# Patient Record
Sex: Female | Born: 1970 | Race: White | Hispanic: No | Marital: Married | Smoking: Never smoker
Health system: Southern US, Community
[De-identification: ages and names within clinical notes are randomized; demographics above are authoritative.]

## PROBLEM LIST (undated history)

## (undated) DIAGNOSIS — F419 Anxiety disorder, unspecified: Secondary | ICD-10-CM

## (undated) DIAGNOSIS — J302 Other seasonal allergic rhinitis: Secondary | ICD-10-CM

## (undated) DIAGNOSIS — C439 Malignant melanoma of skin, unspecified: Secondary | ICD-10-CM

## (undated) DIAGNOSIS — F1910 Other psychoactive substance abuse, uncomplicated: Secondary | ICD-10-CM

## (undated) DIAGNOSIS — E079 Disorder of thyroid, unspecified: Secondary | ICD-10-CM

## (undated) DIAGNOSIS — M1990 Unspecified osteoarthritis, unspecified site: Secondary | ICD-10-CM

## (undated) DIAGNOSIS — F32A Depression, unspecified: Secondary | ICD-10-CM

## (undated) DIAGNOSIS — K219 Gastro-esophageal reflux disease without esophagitis: Secondary | ICD-10-CM

## (undated) HISTORY — DX: Disorder of thyroid, unspecified: E07.9

## (undated) HISTORY — DX: Gastro-esophageal reflux disease without esophagitis: K21.9

## (undated) HISTORY — DX: Other seasonal allergic rhinitis: J30.2

## (undated) HISTORY — DX: Malignant melanoma of skin, unspecified: C43.9

## (undated) HISTORY — DX: Unspecified osteoarthritis, unspecified site: M19.90

## (undated) HISTORY — DX: Other psychoactive substance abuse, uncomplicated: F19.10

## (undated) HISTORY — DX: Anxiety disorder, unspecified: F41.9

## (undated) HISTORY — DX: Depression, unspecified: F32.A

---
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
## 2018-11-07 DIAGNOSIS — S92009A Unspecified fracture of unspecified calcaneus, initial encounter for closed fracture: Secondary | ICD-10-CM | POA: Insufficient documentation

## 2018-11-13 ENCOUNTER — Other Ambulatory Visit: Payer: Self-pay | Admitting: Orthopedic Surgery

## 2018-11-13 DIAGNOSIS — S92025G Nondisplaced fracture of anterior process of left calcaneus, subsequent encounter for fracture with delayed healing: Secondary | ICD-10-CM

## 2018-11-19 ENCOUNTER — Ambulatory Visit
Admission: RE | Admit: 2018-11-19 | Discharge: 2018-11-19 | Disposition: A | Discharge status: Home / Self Care | Payer: Non-veteran care | Source: Ambulatory Visit | Attending: Orthopedic Surgery | Admitting: Orthopedic Surgery

## 2018-11-19 DIAGNOSIS — S92025G Nondisplaced fracture of anterior process of left calcaneus, subsequent encounter for fracture with delayed healing: Secondary | ICD-10-CM

## 2019-01-25 DIAGNOSIS — M25562 Pain in left knee: Secondary | ICD-10-CM | POA: Insufficient documentation

## 2020-07-01 ENCOUNTER — Other Ambulatory Visit (HOSPITAL_COMMUNITY): Payer: Self-pay

## 2020-07-06 ENCOUNTER — Ambulatory Visit: Payer: No Typology Code available for payment source | Admitting: Podiatry

## 2020-07-08 ENCOUNTER — Ambulatory Visit (INDEPENDENT_AMBULATORY_CARE_PROVIDER_SITE_OTHER): Payer: No Typology Code available for payment source | Admitting: Podiatry

## 2020-07-08 ENCOUNTER — Ambulatory Visit (INDEPENDENT_AMBULATORY_CARE_PROVIDER_SITE_OTHER): Payer: No Typology Code available for payment source

## 2020-07-08 ENCOUNTER — Other Ambulatory Visit: Payer: Self-pay

## 2020-07-08 DIAGNOSIS — M722 Plantar fascial fibromatosis: Secondary | ICD-10-CM

## 2020-07-08 DIAGNOSIS — M7751 Other enthesopathy of right foot: Secondary | ICD-10-CM

## 2020-07-08 DIAGNOSIS — M21619 Bunion of unspecified foot: Secondary | ICD-10-CM

## 2020-07-08 DIAGNOSIS — M21611 Bunion of right foot: Secondary | ICD-10-CM

## 2020-07-08 MED ORDER — METHYLPREDNISOLONE 4 MG PO TBPK: ORAL_TABLET | ORAL | 0 refills | Status: DC

## 2020-07-27 NOTE — Progress Notes (Signed)
   Subjective:  50 y.o. female presenting today as a new patient for evaluation of right foot and ankle pain is been going on approximately 3 months now.  Patient states that she experiences sharp pain all over the foot and the ankle.  It radiates all over.  She denies a history of injury.  She has not done anything for treatment.   No past medical history on file.   Objective / Physical Exam:  General:  The patient is alert and oriented x3 in no acute distress. Dermatology:  Skin is warm, dry and supple bilateral lower extremities. Negative for open lesions or macerations. Vascular:  Palpable pedal pulses bilaterally. No edema or erythema noted. Capillary refill within normal limits. Neurological:  Epicritic and protective threshold grossly intact bilaterally.  Musculoskeletal Exam:  Pain on palpation to the anterior lateral medial aspects of the patient's right ankle as well as pain on palpation along the plantar fascia of the right foot. Mild edema noted. Range of motion within normal limits to all pedal and ankle joints bilateral. Muscle strength 5/5 in all groups bilateral.   Radiographic Exam:  Normal osseous mineralization. Joint spaces preserved. No fracture/dislocation/boney destruction.    Assessment: 1.  Plantar fasciitis right 2.  Right dorsal foot pain 3.  Ankle pain/capsulitis right  Plan of Care:  1. Patient was evaluated. X-Rays reviewed.  2.  Patient declined injections today 3.  Appointment with Pedorthist for custom molded orthotics 4.  Prescription for Medrol Dosepak 5.  Prescription for Motrin 800 mg 3 times daily to begin taking after completion of the Dosepak as needed 6.  Return to clinic as needed   Felecia Shelling, DPM [facility]  Dr. Felecia Shelling, DPM    [address] [place], [place] [address]                Office [phone]  Fax [phone]

## 2020-07-29 ENCOUNTER — Telehealth: Payer: Self-pay | Admitting: Podiatry

## 2020-07-29 NOTE — Telephone Encounter (Signed)
Received voicemail from [employer] @ the [facility] stating pt is needing to get inserts and that we cannot dispense thru our office and she wanted a call back to get a documents that we have to fill out for pt. Her ext is [phone]...  I called back and left message for Thereasa Solo to call me back but that I was out of the office until next Tuesday.5.31.2022... but we have gotten orthotics thru the [facility] with Clifton Custard Bage.

## 2020-08-11 NOTE — Telephone Encounter (Signed)
Faxed forms and office visit note to [employer] @ [facility].

## 2020-08-26 ENCOUNTER — Telehealth: Payer: Self-pay | Admitting: Podiatry

## 2020-08-26 NOTE — Telephone Encounter (Signed)
Per Merleen Nicely  bcbs mcr the brace codes L1940/L2820/L2330 are valid and billable no Josem Kaufmann is needed unless over 1200.00 . Covered @ 80% of allowable. Out of pocket is 5800.00 pt has met 263.21.. reference number [phone].Marland KitchenMarland Kitchen

## 2020-08-26 NOTE — Telephone Encounter (Signed)
Error

## 2020-08-28 ENCOUNTER — Telehealth: Payer: Self-pay | Admitting: Podiatry

## 2020-08-28 NOTE — Telephone Encounter (Signed)
Called pt because I have had no luck finding the foam impression box that she said she was molded for when she was here on 5.4.2022. It was on hold waiting on the [place] authorization. She is upset about it. She is going on vacation starting tomorrow and starts a new job next Friday. She lives in [place]. She asked me to look again and I told her I would look next week and ask again. She is having a lot of foot pain and has not time to come back to [place] to be remolded. She may end up switching locations to get the orthotics. I apologized and told her I would call her next week once I look again.

## 2020-09-18 ENCOUNTER — Other Ambulatory Visit: Payer: Self-pay

## 2020-09-18 ENCOUNTER — Other Ambulatory Visit: Payer: No Typology Code available for payment source

## 2020-09-18 DIAGNOSIS — M7751 Other enthesopathy of right foot: Secondary | ICD-10-CM | POA: Diagnosis not present

## 2020-09-18 DIAGNOSIS — M722 Plantar fascial fibromatosis: Secondary | ICD-10-CM | POA: Diagnosis not present

## 2020-10-09 ENCOUNTER — Other Ambulatory Visit: Payer: No Typology Code available for payment source

## 2020-10-09 ENCOUNTER — Other Ambulatory Visit: Payer: Self-pay

## 2020-11-02 ENCOUNTER — Telehealth: Payer: Self-pay | Admitting: Podiatry

## 2020-11-02 NOTE — Telephone Encounter (Signed)
Patient put in an online request to be seen. She is scheduled to see you on 9/14 but wanted to request an MRI before she came in to rule out capsulitis or another underlying issue.

## 2020-11-03 ENCOUNTER — Other Ambulatory Visit: Payer: Self-pay | Admitting: Podiatry

## 2020-11-03 DIAGNOSIS — M79671 Pain in right foot: Secondary | ICD-10-CM

## 2020-11-03 NOTE — Telephone Encounter (Signed)
MRI ordered.  Please contact patient and cancel appointment for 11/18/2020.  Ask the patient to make a new appointment once she knows the date for the MRI.  Ideally I would like to see her about 2 days after the MRI is completed.  Thanks, Dr. Logan Bores

## 2020-11-04 NOTE — Telephone Encounter (Signed)
Called patient and informed her that the MRI was ordered and to call us back once she has it scheduled. I canceled her appt on 9/14.

## 2020-11-18 ENCOUNTER — Ambulatory Visit: Payer: No Typology Code available for payment source | Admitting: Podiatry

## 2020-11-21 ENCOUNTER — Other Ambulatory Visit: Payer: Self-pay

## 2020-11-21 ENCOUNTER — Ambulatory Visit
Admission: RE | Admit: 2020-11-21 | Discharge: 2020-11-21 | Disposition: A | Discharge status: Home / Self Care | Payer: Self-pay | Source: Ambulatory Visit | Attending: Podiatry | Admitting: Podiatry

## 2020-11-21 DIAGNOSIS — M79671 Pain in right foot: Secondary | ICD-10-CM

## 2020-12-14 ENCOUNTER — Other Ambulatory Visit: Payer: Self-pay

## 2020-12-14 ENCOUNTER — Ambulatory Visit (INDEPENDENT_AMBULATORY_CARE_PROVIDER_SITE_OTHER): Payer: No Typology Code available for payment source | Admitting: Podiatry

## 2020-12-14 DIAGNOSIS — M2011 Hallux valgus (acquired), right foot: Secondary | ICD-10-CM

## 2020-12-14 DIAGNOSIS — M2041 Other hammer toe(s) (acquired), right foot: Secondary | ICD-10-CM | POA: Diagnosis not present

## 2020-12-14 DIAGNOSIS — M722 Plantar fascial fibromatosis: Secondary | ICD-10-CM | POA: Diagnosis not present

## 2020-12-14 DIAGNOSIS — M7751 Other enthesopathy of right foot: Secondary | ICD-10-CM

## 2020-12-14 MED ORDER — METHYLPREDNISOLONE 4 MG PO TBPK: ORAL_TABLET | ORAL | 0 refills | Status: DC

## 2020-12-27 DIAGNOSIS — M7751 Other enthesopathy of right foot: Secondary | ICD-10-CM | POA: Diagnosis not present

## 2020-12-27 MED ORDER — BETAMETHASONE SOD PHOS & ACET 6 (3-3) MG/ML IJ SUSP
3.0000 mg | Freq: Once | INTRAMUSCULAR | Status: AC
  Administered 2020-12-27: 3 mg via INTRA_ARTICULAR

## 2020-12-27 NOTE — Progress Notes (Signed)
   Subjective: 50 y.o. female presenting today for evaluation of pain and tenderness to the right foot.  Patient states there has been no significant improvement since she was last seen in the office several months prior.  She continues to have pain with walking.  She presents for further treatment and evaluation  No past medical history on file.   Objective: Physical Exam General: The patient is alert and oriented x3 in no acute distress.  Dermatology: Skin is cool, dry and supple bilateral lower extremities. Negative for open lesions or macerations.  Vascular: Palpable pedal pulses bilaterally. No edema or erythema noted. Capillary refill within normal limits.  Neurological: Epicritic and protective threshold grossly intact bilaterally.   Musculoskeletal Exam: Clinical evidence of bunion deformity noted to the respective foot. There is moderate pain on palpation range of motion of the first MPJ. Lateral deviation of the hallux noted consistent with hallux abductovalgus. Hammertoe contracture also noted on clinical exam to digits 2 of the right foot. Symptomatic pain on palpation and range of motion also noted to the metatarsal phalangeal joints of the respective hammertoe digits.    MR foot RT wo contrast 11/21/2020: 1. Possible inflammatory changes plantar to the 2nd metatarsophalangeal joint with a small joint effusion and mild intermetatarsal bursitis in the 1st web space. 2. The forefoot muscles and tendons appear unremarkable. The visualized distal aspect of the plantar fascia appears normal. 3. Mild midfoot and 1st MTP degenerative changes. No acute osseous findings.   Assessment: 1. HAV w/ bunion deformity right 2. Hammertoe deformity second right 3.  Second MTP joint capsulitis right   Plan of Care:  1. Patient was evaluated. 2.  Injection of 0.5 cc Celestone Soluspan injection of the second MTP joint right foot 3.  Prescription for Medrol Dosepak.  Continue the Motrin  800 mg after completion of the Dosepak 4.  Continue wearing custom molded orthotics.  Appointment made today with Pedorthist to modify the metatarsal pads built within the orthotics 5.  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 Felecia Shelling, DPM    [address] [place], [place] [address]                Office [phone]  Fax [phone]

## 2021-02-05 ENCOUNTER — Other Ambulatory Visit: Payer: No Typology Code available for payment source

## 2021-04-09 DIAGNOSIS — K219 Gastro-esophageal reflux disease without esophagitis: Secondary | ICD-10-CM | POA: Insufficient documentation

## 2021-04-09 DIAGNOSIS — H6692 Otitis media, unspecified, left ear: Secondary | ICD-10-CM | POA: Insufficient documentation

## 2021-04-09 DIAGNOSIS — E0590 Thyrotoxicosis, unspecified without thyrotoxic crisis or storm: Secondary | ICD-10-CM | POA: Insufficient documentation

## 2021-04-09 DIAGNOSIS — F419 Anxiety disorder, unspecified: Secondary | ICD-10-CM | POA: Insufficient documentation

## 2021-04-12 ENCOUNTER — Other Ambulatory Visit: Payer: Self-pay

## 2021-04-12 ENCOUNTER — Ambulatory Visit (INDEPENDENT_AMBULATORY_CARE_PROVIDER_SITE_OTHER): Payer: Non-veteran care | Admitting: Podiatry

## 2021-04-12 DIAGNOSIS — M2011 Hallux valgus (acquired), right foot: Secondary | ICD-10-CM

## 2021-04-12 DIAGNOSIS — M7751 Other enthesopathy of right foot: Secondary | ICD-10-CM

## 2021-04-12 MED ORDER — BETAMETHASONE SOD PHOS & ACET 6 (3-3) MG/ML IJ SUSP
3.0000 mg | Freq: Once | INTRAMUSCULAR | Status: AC
  Administered 2021-04-12: 3 mg via INTRA_ARTICULAR

## 2021-04-12 NOTE — Progress Notes (Signed)
° °  Subjective: 51 y.o. female presenting today for follow-up evaluation of pain and tenderness to the right foot.  Patient states the injections helped significantly for a few months.  She has also been wearing the orthotics.  She does say that over the past month she has had an increase in pain and tenderness associated the right forefoot.  She would like to discuss surgery but she is going on a trip to [place] this summer and would like to plan for surgery after returning from her trip.  No past medical history on file.   Objective: Physical Exam General: The patient is alert and oriented x3 in no acute distress.  Dermatology: Skin is cool, dry and supple bilateral lower extremities. Negative for open lesions or macerations.  Vascular: Palpable pedal pulses bilaterally. No edema or erythema noted. Capillary refill within normal limits.  Neurological: Epicritic and protective threshold grossly intact bilaterally.   Musculoskeletal Exam: Clinical evidence of bunion deformity noted to the respective foot. There is moderate pain on palpation range of motion of the first MPJ. Lateral deviation of the hallux noted consistent with hallux abductovalgus. Hammertoe contracture also noted on clinical exam to digits 2 of the right foot. Symptomatic pain on palpation and range of motion also noted to the metatarsal phalangeal joints of the respective hammertoe digits as well as the first MTP.    MR foot RT wo contrast 11/21/2020: 1. Possible inflammatory changes plantar to the 2nd metatarsophalangeal joint with a small joint effusion and mild intermetatarsal bursitis in the 1st web space. 2. The forefoot muscles and tendons appear unremarkable. The visualized distal aspect of the plantar fascia appears normal. 3. Mild midfoot and 1st MTP degenerative changes. No acute osseous findings.   Assessment: 1. HAV w/ bunion deformity right 2. Hammertoe deformity second right 3.  First and second MTP joint  capsulitis right   Plan of Care:  1. Patient was evaluated. 2.  Injection of 0.5 cc Celestone Soluspan injection of the first and second MTP joint right foot 3.  Continue prescription strength Motrin 800 as per prescribing physician  4.  Continue wearing custom molded orthotics.  Patient states that once they were broken and they feel much better.  She never had the modified 5.  Patient is going on a trip to [place] May 31 - mid June.  Return to clinic just prior to trip for possible injection but also to schedule surgery correction of the bunion and hammertoe deformity.  Today we again lightly discussed surgery associated to the patient's symptoms and pathology.  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 Felecia Shelling, DPM    [address] [place], [place] [address]                Office [phone]  Fax [phone]

## 2021-07-28 ENCOUNTER — Ambulatory Visit: Payer: Non-veteran care | Admitting: Podiatry

## 2021-07-28 ENCOUNTER — Ambulatory Visit (INDEPENDENT_AMBULATORY_CARE_PROVIDER_SITE_OTHER): Payer: Non-veteran care | Admitting: Podiatry

## 2021-07-28 DIAGNOSIS — M2011 Hallux valgus (acquired), right foot: Secondary | ICD-10-CM | POA: Diagnosis not present

## 2021-07-28 DIAGNOSIS — M7751 Other enthesopathy of right foot: Secondary | ICD-10-CM

## 2021-07-28 DIAGNOSIS — M2041 Other hammer toe(s) (acquired), right foot: Secondary | ICD-10-CM

## 2021-07-28 MED ORDER — BETAMETHASONE SOD PHOS & ACET 6 (3-3) MG/ML IJ SUSP
3.0000 mg | Freq: Once | INTRAMUSCULAR | Status: AC
  Administered 2021-07-28: 3 mg via INTRA_ARTICULAR

## 2021-07-28 NOTE — Progress Notes (Signed)
Subjective: 51 y.o. female presenting today for follow-up evaluation of pain and tenderness to the right foot.  Patient states the injections helped significantly for a few months.  She has also been wearing the orthotics.  She does say that over the past month she has had an increase in pain and tenderness associated the right forefoot.  She would like to discuss surgery but she is going on a trip to [place] this summer and would like to plan for surgery after returning from her trip.  No past medical history on file.   Objective: Physical Exam General: The patient is alert and oriented x3 in no acute distress.  Dermatology: Skin is cool, dry and supple bilateral lower extremities. Negative for open lesions or macerations.  Vascular: Palpable pedal pulses bilaterally. No edema or erythema noted. Capillary refill within normal limits.  Neurological: Epicritic and protective threshold grossly intact bilaterally.   Musculoskeletal Exam: Clinical evidence of bunion deformity noted to the respective foot. There is moderate pain on palpation range of motion of the first MPJ. Lateral deviation of the hallux noted consistent with hallux abductovalgus. Hammertoe contracture also noted on clinical exam to digits 2 of the right foot. Symptomatic pain on palpation and range of motion also noted to the metatarsal phalangeal joints of the respective hammertoe digits as well as the first MTP.    MR foot RT wo contrast 11/21/2020: 1. Possible inflammatory changes plantar to the 2nd metatarsophalangeal joint with a small joint effusion and mild intermetatarsal bursitis in the 1st web space. 2. The forefoot muscles and tendons appear unremarkable. The visualized distal aspect of the plantar fascia appears normal. 3. Mild midfoot and 1st MTP degenerative changes. No acute osseous findings.   Assessment: 1. HAV w/ bunion deformity right 2. Hammertoe deformity second right 3.  First and second MTP joint  capsulitis right   Plan of Care:  1. Patient was evaluated. 2.  Injection of 0.5 cc Celestone Soluspan injection of the first and second MTP joint right foot 3.  Continue prescription strength Motrin 800 as per prescribing physician.  Patient also received a Medrol Dosepak from her orthopedist, Dr. Cay Schillings, For left knee pain. 4.  Continue wearing custom molded orthotics.   5.  Patient is going on a trip to [place] May 31 - mid June.   6. Today we discussed in detail the conservative versus different surgical surgical management of the presenting pathology of the bunion and hammertoe deformity. The patient opts for surgical management.  Unfortunately conservative treatment has been unsuccessful in providing any sort of lasting alleviation of symptoms for the patient.  All possible complications and details of the procedure were explained. All patient questions were answered. No guarantees were expressed or implied. 7. Authorization for surgery was initiated today. Surgery will consist of bunionectomy with first metatarsal osteotomy right.  Hammertoe arthroplasty with capsulotomy second right.  Possible Weil shortening osteotomy second metatarsal right 8.  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    Edrick Kins, DPM [facility]  Dr. Edrick Kins, DPM    [address] [place], [place] [address]  Office [phone]  Fax [phone]

## 2021-08-05 HISTORY — PX: FOOT SURGERY: SHX648

## 2021-08-19 ENCOUNTER — Other Ambulatory Visit: Payer: Self-pay | Admitting: Podiatry

## 2021-08-19 DIAGNOSIS — M2011 Hallux valgus (acquired), right foot: Secondary | ICD-10-CM

## 2021-08-19 DIAGNOSIS — M2041 Other hammer toe(s) (acquired), right foot: Secondary | ICD-10-CM

## 2021-08-19 MED ORDER — OXYCODONE-ACETAMINOPHEN 5-325 MG PO TABS: 1.0000 | ORAL_TABLET | ORAL | 0 refills | Status: DC | PRN

## 2021-08-19 MED ORDER — IBUPROFEN 800 MG PO TABS: 800.0000 mg | ORAL_TABLET | Freq: Three times a day (TID) | ORAL | 1 refills | Status: DC

## 2021-08-19 NOTE — Progress Notes (Signed)
PRN postop 

## 2021-08-25 ENCOUNTER — Encounter: Payer: Self-pay | Admitting: Podiatry

## 2021-08-25 ENCOUNTER — Ambulatory Visit (INDEPENDENT_AMBULATORY_CARE_PROVIDER_SITE_OTHER): Payer: Non-veteran care

## 2021-08-25 ENCOUNTER — Ambulatory Visit (INDEPENDENT_AMBULATORY_CARE_PROVIDER_SITE_OTHER): Payer: No Typology Code available for payment source | Admitting: Podiatry

## 2021-08-25 ENCOUNTER — Other Ambulatory Visit: Payer: Self-pay | Admitting: Podiatry

## 2021-08-25 DIAGNOSIS — Z9889 Other specified postprocedural states: Secondary | ICD-10-CM

## 2021-08-25 NOTE — Progress Notes (Signed)
PRN postop 

## 2021-08-25 NOTE — Progress Notes (Signed)
   Subjective:  Patient presents today status post bunionectomy with double osteotomy, hammertoe repair second RT. DOS: 08/19/2021.  Patient states that she is doing well.  Pain is very tolerable.  She is no longer taking any pain medication.  She has kept the dressings clean dry and intact and minimally weightbearing in the cam boot as instructed.  No new complaints at this time  No past medical history on file.  Allergies  Allergen Reactions   Black Walnut Pollen Allergy Skin Test     Other reaction(s): Edema   Epinephrine     Other reaction(s): Irregular Heart Rate   Fluticasone     Other reaction(s): Migraine   Promethazine     Other reaction(s): Palpitation     Objective/Physical Exam Neurovascular status intact.  Skin incisions appear to be well coapted with sutures intact. No sign of infectious process noted. No dehiscence. No active bleeding noted. Moderate edema with some ecchymosis noted to the surgical extremity.  Radiographic Exam:  Orthopedic hardware and osteotomies sites appear to be stable with routine healing.  Good realignment of the first ray.  Percutaneous pin to the second digit is intact.  Arthrodesis/arthroplasty of the PIPJ noted.  Good apposition of the arthrodesis site  Assessment: 1. s/p bunionectomy with double osteotomy and hammertoe repair second RT. DOS: 08/19/2021   Plan of Care:  1. Patient was evaluated. X-rays reviewed 2.  Dressings changed.  Clean dry intact x1 week 3.  Continue minimal weightbearing in the cam boot 4.  Return to clinic in 1 week with the nurse for dressing change.  I will see the patient in 2 weeks for suture removal   Felecia Shelling, DPM [facility]  Dr. Felecia Shelling, DPM    [address] [place], [place] [address]                Office [phone]  Fax [phone]

## 2021-09-01 ENCOUNTER — Ambulatory Visit (INDEPENDENT_AMBULATORY_CARE_PROVIDER_SITE_OTHER): Payer: No Typology Code available for payment source | Admitting: Podiatry

## 2021-09-01 DIAGNOSIS — Z9889 Other specified postprocedural states: Secondary | ICD-10-CM

## 2021-09-08 NOTE — Progress Notes (Signed)
   Subjective:  Patient presents today status post bunionectomy with double osteotomy, hammertoe repair second RT. DOS: 08/19/2021.  Patient states that she is doing well.  Pain is very tolerable.  She is here for dressing change today denies any other acute complaints.  Minimal pain.  No past medical history on file.  Allergies  Allergen Reactions   Black Walnut Pollen Allergy Skin Test     Other reaction(s): Edema   Epinephrine     Other reaction(s): Irregular Heart Rate   Fluticasone     Other reaction(s): Migraine   Promethazine     Other reaction(s): Palpitation     Objective/Physical Exam Neurovascular status intact.  Skin incisions appear to be well coapted with sutures intact. No sign of infectious process noted. No dehiscence. No active bleeding noted. Moderate edema with some ecchymosis noted to the surgical extremity.  Radiographic Exam:  Orthopedic hardware and osteotomies sites appear to be stable with routine healing.  Good realignment of the first ray.  Percutaneous pin to the second digit is intact.  Arthrodesis/arthroplasty of the PIPJ noted.  Good apposition of the arthrodesis site  Assessment: 1. s/p bunionectomy with double osteotomy and hammertoe repair second RT. DOS: 08/19/2021   Plan of Care:  1. Patient was evaluated. X-rays reviewed 2.  Dressings changed.  Clean dry intact x1 week 3.  Continue minimal weightbearing in the cam boot 4.  Dressing was changed no signs of infection noted.  No signs of Deis is noted.

## 2021-09-14 ENCOUNTER — Ambulatory Visit (INDEPENDENT_AMBULATORY_CARE_PROVIDER_SITE_OTHER): Payer: No Typology Code available for payment source | Admitting: Podiatry

## 2021-09-14 ENCOUNTER — Ambulatory Visit (INDEPENDENT_AMBULATORY_CARE_PROVIDER_SITE_OTHER): Payer: Non-veteran care

## 2021-09-14 DIAGNOSIS — Z9889 Other specified postprocedural states: Secondary | ICD-10-CM

## 2021-09-15 ENCOUNTER — Encounter: Payer: No Typology Code available for payment source | Admitting: Podiatry

## 2021-09-24 NOTE — Progress Notes (Signed)
   Chief Complaint  Patient presents with   Routine Post Op    POV #3 DOS 08/19/2021 BUNIONECTOMY W/OSTEOTOMY RT, HAMMERTOE ARTHROPLASTY W/CAPSULOTOMY 2ND RT, POSS WEIL SHORTENING OSTEOTOMY 2ND METATARSAL RT    Subjective:  Patient presents today status post bunionectomy with double osteotomy, hammertoe repair second RT. DOS: 08/19/2021.  Patient doing well.  She continues to be weightbearing in the cam boot.  No new complaints at this time  No past medical history on file.  Allergies  Allergen Reactions   Black Walnut Pollen Allergy Skin Test     Other reaction(s): Edema   Epinephrine     Other reaction(s): Irregular Heart Rate   Fluticasone     Other reaction(s): Migraine   Promethazine     Other reaction(s): Palpitation     Objective/Physical Exam Neurovascular status intact.  Skin incisions appear to be well coapted with sutures intact. No sign of infectious process noted. No dehiscence. No active bleeding noted. Moderate edema with some ecchymosis noted to the surgical extremity.  Radiographic Exam:  Orthopedic hardware and osteotomies sites appear to be stable with routine healing.  Good realignment of the first ray.  Percutaneous pin to the second digit is intact.  Arthrodesis/arthroplasty of the PIPJ noted.  Good apposition of the arthrodesis site  Assessment: 1. s/p bunionectomy with double osteotomy and hammertoe repair second RT. DOS: 08/19/2021   Plan of Care:  1. Patient was evaluated. X-rays reviewed 2.  Sutures and percutaneous fixation pin removed today 3.  Continue minimal weightbearing in the cam boot for 2 additional weeks.  At that time the patient may begin to transition out of the cam boot into good supportive sneakers and shoes 4.  Return to clinic in 4 weeks for follow-up x-ray  Felecia Shelling, DPM [facility]  Dr. Felecia Shelling, DPM    [address] [place], [place] [address]                Office  [phone]  Fax [phone]

## 2021-10-01 ENCOUNTER — Telehealth: Payer: Self-pay

## 2021-10-01 NOTE — Telephone Encounter (Signed)
Spoke with patient.  She has resumed the cam walker boot.  She will stay in the boot until I see her on the next appointment 10/11/2021.  We will do follow-up x-rays next visit 10/11/2021.  Thanks, Dr. Logan Bores

## 2021-10-11 ENCOUNTER — Ambulatory Visit (INDEPENDENT_AMBULATORY_CARE_PROVIDER_SITE_OTHER): Payer: Non-veteran care

## 2021-10-11 ENCOUNTER — Ambulatory Visit (INDEPENDENT_AMBULATORY_CARE_PROVIDER_SITE_OTHER): Payer: No Typology Code available for payment source | Admitting: Podiatry

## 2021-10-11 DIAGNOSIS — Z9889 Other specified postprocedural states: Secondary | ICD-10-CM

## 2021-10-11 NOTE — Progress Notes (Signed)
   Chief Complaint  Patient presents with   Post-op Follow-up    POV #4 DOS 08/19/2021 BUNIONECTOMY W/OSTEOTOMY RT, HAMMERTOE ARTHROPLASTY W/CAPSULOTOMY 2ND RT, POSS WEIL SHORTENING OSTEOTOMY 2ND METATARSAL RT    Subjective:  Patient presents today status post bunionectomy with double osteotomy, hammertoe repair second RT. DOS: 08/19/2021.  Patient is doing well however she does state by the end of the day she experiences a significant amount of swelling to the foot.  No new complaints at this time  No past medical history on file.  Allergies  Allergen Reactions   Black Walnut Pollen Allergy Skin Test     Other reaction(s): Edema   Epinephrine     Other reaction(s): Irregular Heart Rate   Fluticasone     Other reaction(s): Migraine   Promethazine     Other reaction(s): Palpitation     Objective/Physical Exam Neurovascular status intact.  Skin incisions healed.  No erythema.  There is a significant amount of edema around the forefoot surgical area.  Radiographic Exam RT foot 10/11/2021:  Orthopedic hardware and osteotomies sites appear to be stable with routine healing.  No significant change since prior x-rays.  There does appear to be some lateral deviation of the second digit at the level of the MTP joint right foot.  Assessment: 1. s/p bunionectomy with double osteotomy and hammertoe repair second RT. DOS: 08/19/2021   Plan of Care:  1. Patient was evaluated. X-rays reviewed which appear stable. 2.  Patient has a significant amount of localized swelling without erythema to the surgical foot.  She has been very active and on her feet and she notices the most swelling by the end of the day. 3.  Compression ankle sleeve dispensed.  Wear daily 4.  Postsurgical shoe dispensed.  Wear daily.  Recommend reducing activity and elevation with ice is much as possible  5.  Patient also uses the knee scooter occasionally.  Continue as needed  6.  Return to clinic in 4 weeks for follow-up  x-ray  *Vice principal at a school.  Felecia Shelling, DPM [facility]  Dr. Felecia Shelling, DPM    [address] [place], [place] [address]                Office [phone]  Fax [phone]

## 2021-11-15 ENCOUNTER — Ambulatory Visit (INDEPENDENT_AMBULATORY_CARE_PROVIDER_SITE_OTHER): Payer: No Typology Code available for payment source | Admitting: Podiatry

## 2021-11-15 ENCOUNTER — Ambulatory Visit (INDEPENDENT_AMBULATORY_CARE_PROVIDER_SITE_OTHER): Payer: Non-veteran care

## 2021-11-15 ENCOUNTER — Ambulatory Visit: Payer: Non-veteran care

## 2021-11-15 DIAGNOSIS — Z9889 Other specified postprocedural states: Secondary | ICD-10-CM | POA: Diagnosis not present

## 2021-11-15 NOTE — Progress Notes (Signed)
   Chief Complaint  Patient presents with   Post-op Follow-up    Post op right foot, the patient states that she is still having swelling and pain.    Subjective:  Patient presents today status post bunionectomy with double osteotomy, hammertoe repair second RT. DOS: 08/19/2021.  Patient continues to have pain with swelling to the right foot especially after working all day.  The pain is mostly into the second toe.  She has been wearing good supportive shoes and sneakers.  No past medical history on file.  Allergies  Allergen Reactions   Black Walnut Pollen Allergy Skin Test     Other reaction(s): Edema   Epinephrine     Other reaction(s): Irregular Heart Rate   Fluticasone     Other reaction(s): Migraine   Promethazine     Other reaction(s): Palpitation      Objective/Physical Exam Neurovascular status intact.  Skin incisions healed.  There continues to be hypertrophic scar formation along the bunion site.  No erythema.  There continues to be some moderate edema.  There is also some pain on palpation with limited range of motion of the second MTP joint of the right foot likely secondary to scar tissue  Radiographic Exam RT foot 11/15/2021:  Orthopedic hardware and osteotomies sites appear to be stable with routine healing.  There continues to be lateral deviation of the second digit at the level of the MTP joint right foot.  Assessment: 1. s/p bunionectomy with double osteotomy and hammertoe repair second RT. DOS: 08/19/2021   Plan of Care:  1. Patient was evaluated.  X-rays reviewed. 2.  Unfortunately the patient continues to have lateral deviation to the right second digit.  I did discuss the possibility of revisional lateral release of the second MTP joint versus continued conservative care.  The patient states that she does have a family history of excessive scar tissue formation.  She says that her brother has had multiple surgeries to the foot and ankle with hypertrophic  scars.  I do believe some of the lateral deviation and limited range of motion second MTP is secondary to scar tissue formation 3.  Continue Mederma topical scar gel to the incision sites to reduce the hypertrophic scar formation  4.  Inject 0.5 cc Celestone Soluspan injected into the second MTP lateral aspect right foot to help break up scar tissue and reduce inflammation. 5.  Recommend daily stretching and range of motion exercises to the second MTP 6.  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 It is a [employer]  Felecia Shelling, DPM [facility]  Dr. Felecia Shelling, DPM    [address] [place], [place] [address]                Office [phone]  Fax [phone]

## 2022-02-07 ENCOUNTER — Ambulatory Visit (INDEPENDENT_AMBULATORY_CARE_PROVIDER_SITE_OTHER): Payer: No Typology Code available for payment source | Admitting: Podiatry

## 2022-02-07 ENCOUNTER — Ambulatory Visit (INDEPENDENT_AMBULATORY_CARE_PROVIDER_SITE_OTHER): Payer: No Typology Code available for payment source

## 2022-02-07 DIAGNOSIS — M7741 Metatarsalgia, right foot: Secondary | ICD-10-CM | POA: Diagnosis not present

## 2022-02-07 NOTE — Progress Notes (Signed)
   Chief Complaint  Patient presents with   Bunions    Right foot bunion.. constant pain around bunion sit overall there is pain within the whole foot. Some swelling around bunion sit but no redness.    Subjective:  Patient presents today status post bunionectomy with double osteotomy, hammertoe repair second RT. DOS: 08/19/2021.  Patient states that she continues to have pain and tenderness associated to the right surgical foot especially after working all day.  She says that the orthotics do not help.  She continues to have pain and tenderness on a daily basis.  Presenting for further treatment and evaluation  No past medical history on file.  Allergies  Allergen Reactions   Black Walnut Pollen Allergy Skin Test     Other reaction(s): Edema   Epinephrine     Other reaction(s): Irregular Heart Rate   Fluticasone     Other reaction(s): Migraine   Promethazine     Other reaction(s): Palpitation    11/15/2021  Objective/Physical Exam There continues to be lateral deviation of the second toe at the level of the MTP.  Associated tenderness with palpation throughout the joint.  There is also some minimal tenderness with palpation throughout the first ray likely secondary to compensation and abnormal gait  Radiographic Exam RT foot 02/07/2022:  Orthopedic screws and osteotomies sites appear to be stable with complete healing of the osteotomy sites.  There continues to be lateral deviation of the second digit at the level of the MTP joint right foot.  The second metatarsal head does extend well beyond the metatarsal parabola likely contributing to the lateral deviation of the second toe  Assessment: 1. s/p bunionectomy with double osteotomy and hammertoe repair second RT. DOS: 08/19/2021   Plan of Care:  1. Patient was evaluated.  X-rays reviewed. 2.  Today again we discussed the patient's pathology.  I do believe that the patient's lateral deviation of the second toe is secondary to the  elongation of the second metatarsal in relationship to the metatarsal parabola.  We discussed continued conservative care versus surgical management which would include Weil shortening osteotomy of the second metatarsal.  After discussing the risk benefits advantages and disadvantages the patient would like to pursue surgery to correct for the elongated metatarsal and potentially create a more rectus alignment of the second toe.  All patient questions were answered.  No guarantees were expressed or implied. 3.  Authorization for surgery was initiated today.  Surgery will consist of Weil shortening osteotomy second metatarsal right 4.  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  Presented today with her husband Felecia Shelling, DPM [facility]  Dr. Felecia Shelling, DPM    [address] [place], [place] [address]                Office [phone]  Fax [phone]

## 2022-02-08 ENCOUNTER — Telehealth: Payer: Self-pay | Admitting: Podiatry

## 2022-02-08 NOTE — Telephone Encounter (Signed)
DOS: 02/24/2022  [facility]  Metatarsal Osteotomy 2nd Rt [phone])  [facility] Authorization #: [id number] Referral Valid: 10/08/2021 - 04/09/2022

## 2022-02-23 ENCOUNTER — Ambulatory Visit: Payer: No Typology Code available for payment source | Admitting: Podiatry

## 2022-03-01 ENCOUNTER — Other Ambulatory Visit: Payer: Self-pay | Admitting: Podiatry

## 2022-03-01 DIAGNOSIS — M21541 Acquired clubfoot, right foot: Secondary | ICD-10-CM

## 2022-03-01 MED ORDER — OXYCODONE-ACETAMINOPHEN 5-325 MG PO TABS: 1.0000 | ORAL_TABLET | ORAL | 0 refills | Status: DC | PRN

## 2022-03-02 ENCOUNTER — Encounter: Payer: No Typology Code available for payment source | Admitting: Podiatry

## 2022-03-08 ENCOUNTER — Ambulatory Visit (INDEPENDENT_AMBULATORY_CARE_PROVIDER_SITE_OTHER): Payer: No Typology Code available for payment source

## 2022-03-08 ENCOUNTER — Ambulatory Visit (INDEPENDENT_AMBULATORY_CARE_PROVIDER_SITE_OTHER): Payer: No Typology Code available for payment source | Admitting: Podiatry

## 2022-03-08 DIAGNOSIS — Z9889 Other specified postprocedural states: Secondary | ICD-10-CM

## 2022-03-08 MED ORDER — OXYCODONE-ACETAMINOPHEN 5-325 MG PO TABS: 1.0000 | ORAL_TABLET | Freq: Three times a day (TID) | ORAL | 0 refills | Status: DC | PRN

## 2022-03-08 NOTE — Progress Notes (Signed)
   No chief complaint on file.   Subjective:  Patient presents today status post Weil shortening osteotomy of the second metatarsal right foot.  DOS: 03/01/2022.  Patient states that she is doing well.  Pain is tolerable.  She is WBAT in the cam boot.  No past medical history on file.  Allergies  Allergen Reactions   Black Walnut Pollen Allergy Skin Test     Other reaction(s): Edema   Epinephrine     Other reaction(s): Irregular Heart Rate   Fluticasone     Other reaction(s): Migraine   Promethazine     Other reaction(s): Palpitation    Objective/Physical Exam Neurovascular status intact.  Incision well coapted with sutures intact. No sign of infectious process noted. No dehiscence. No active bleeding noted.  Minimal edema noted around the surgical area  Radiographic Exam RT foot 03/08/2022:  Shortening osteotomy site of the second metatarsal with increased joint space noted to the second MTP.  Orthopedic screws intact.  Routine healing of the first ray noted with orthopedic screws intact  Assessment: 1. s/p Weil shortening osteotomy second metatarsal right. DOS: 03/01/2022   Plan of Care:  1. Patient was evaluated. X-rays reviewed 2.  Patient may begin washing and showering and getting the foot wet 3.  Continue WBAT cam boot 4.  Return to clinic 1 week for possible suture removal   Edrick Kins, DPM [facility]  Dr. Edrick Kins, DPM    [address], [place] [address]                Office [phone]  Fax [phone]

## 2022-03-09 ENCOUNTER — Encounter: Payer: No Typology Code available for payment source | Admitting: Podiatry

## 2022-03-16 ENCOUNTER — Ambulatory Visit (INDEPENDENT_AMBULATORY_CARE_PROVIDER_SITE_OTHER): Payer: No Typology Code available for payment source | Admitting: Podiatry

## 2022-03-16 VITALS — BP 108/66 | HR 77

## 2022-03-16 DIAGNOSIS — Z9889 Other specified postprocedural states: Secondary | ICD-10-CM

## 2022-03-16 NOTE — Progress Notes (Signed)
   Chief Complaint  Patient presents with   Routine Post Op    Patient came in today for pov#2 dos 12.26.2023 metatarsal osteotomy 2nd rt, Patient denies any pain, NO N/V/F/C/SOB,     Subjective:  Patient presents today status post Weil shortening osteotomy of the second metatarsal right foot.  DOS: 03/01/2022.  Patient states today that she is experiencing some transferred pain throughout the foot.  She states that she is concerned that possibly the third and fourth digits of the foot are shifting.  She has been WBAT in the cam boot.  No past medical history on file.  Allergies  Allergen Reactions   Black Walnut Pollen Allergy Skin Test     Other reaction(s): Edema   Epinephrine     Other reaction(s): Irregular Heart Rate   Fluticasone     Other reaction(s): Migraine   Promethazine     Other reaction(s): Palpitation    Objective/Physical Exam Neurovascular status intact.  Incision well coapted with sutures intact. No sign of infectious process noted. No dehiscence. No active bleeding noted.  Minimal edema noted around the surgical area  Radiographic Exam RT foot 03/08/2022:  Shortening osteotomy site of the second metatarsal with increased joint space noted to the second MTP.  Orthopedic screws intact.  Routine healing of the first ray noted with orthopedic screws intact  Assessment: 1. s/p Weil shortening osteotomy second metatarsal right. DOS: 03/01/2022   Plan of Care:  1. Patient was evaluated. X-rays reviewed 2.  Sutures removed today. 3. Continue WBAT in CAM boot 4. RTC 2 weeks for f/u xrays   Edrick Kins, DPM [facility]  Dr. Edrick Kins, DPM    [address], [place] [address]                Office [phone]  Fax [phone]

## 2022-03-23 ENCOUNTER — Encounter: Payer: No Typology Code available for payment source | Admitting: Podiatry

## 2022-03-30 ENCOUNTER — Encounter: Payer: No Typology Code available for payment source | Admitting: Podiatry

## 2022-04-01 ENCOUNTER — Ambulatory Visit (INDEPENDENT_AMBULATORY_CARE_PROVIDER_SITE_OTHER): Payer: No Typology Code available for payment source | Admitting: Podiatry

## 2022-04-01 ENCOUNTER — Ambulatory Visit (INDEPENDENT_AMBULATORY_CARE_PROVIDER_SITE_OTHER): Payer: No Typology Code available for payment source

## 2022-04-01 DIAGNOSIS — Z9889 Other specified postprocedural states: Secondary | ICD-10-CM

## 2022-04-01 NOTE — Progress Notes (Signed)
   Chief Complaint  Patient presents with   Routine Post Op    Rm 7  pov#3 dos 12.26.2023 metatarsal osteotomy 2nd rt. 2 week follow up xrays. Pt states he is still experiencing a lot of pain in the ball of her right foot. Pt states she also noticed a shirt in her right great toe going outward.     Subjective:  Patient presents today status post Weil shortening osteotomy of the second metatarsal right foot.  DOS: 03/01/2022.  Also PSxHx bunionectomy with double osteotomy, hammertoe repair second RT. DOS: 08/19/2021.  Patient states that she continues to have pain specifically around the great toe and the second toe joint of the right foot.  WBAT in the cam boot still. Patient states that over the past few weeks she also noticed that her great toe joint was deviating and it is very pronounced by the end of the day.  No past medical history on file.  Allergies  Allergen Reactions   Black Walnut Pollen Allergy Skin Test     Other reaction(s): Edema   Epinephrine     Other reaction(s): Irregular Heart Rate   Fluticasone     Other reaction(s): Migraine   Promethazine     Other reaction(s): Palpitation    Objective/Physical Exam Neurovascular status intact.  Skin incisions healed.  Limited range of motion of the second MTP with slight dorsal contracture.  Likely secondary to scar tissue.  There is also pain on palpation to the first and second MTP joints.  Radiographic Exam RT foot 04/01/2022:  Shortening osteotomy site of the second metatarsal with increased joint space noted to the second MTP.  Compared to prior x-rays now there is transverse subluxation of the first MTP with increased hallux abductus angle.  Orthopedic screws are intact.  Assessment: 1. s/p Weil shortening osteotomy second metatarsal right. DOS: 03/01/2022 2. PSxHx bunionectomy with double osteotomy and hammertoe repair second right.  DOS: 08/19/2021 3.  Subluxed first MTP right   Plan of Care:  1. Patient was  evaluated. X-rays reviewed 2.  Subluxation of the first MTP of the right foot is a new finding.  Unfortunately due to the subluxing joint the patient will likely need first MTP arthrodesis to realign the great toe and permanently fixate in a rectus position. 3.  Also, injection of 0.5 cc Celestone Soluspan injected along the dorsum of the second MTP to break up scar tissue.  Recommend daily plantarflexion of the toe 4. Will plan to discuss patient's case at our next quarterly Case review meeting to get other surgeons opinions within our practice which is scheduled for 04/21/2022. 5.  Return to clinic after case review meeting to discuss further treatment options and possible surgical consent  Edrick Kins, DPM [facility]  Dr. Edrick Kins, DPM    [address], [place] [address]                Office [phone]  Fax [phone]

## 2022-04-07 ENCOUNTER — Telehealth: Payer: Self-pay | Admitting: Podiatry

## 2022-04-07 NOTE — Telephone Encounter (Signed)
Pt called and is scheduled for her 4th post op on 2.19 and pt is asking if it could be a virtual appt. She has sent a message thru my chart earlier this week and I did explain that Dr Amalia Hailey is out of the office this week. I told pt I would send a message as well but she will not hear back until next week. She said ok.

## 2022-04-15 NOTE — Telephone Encounter (Signed)
Okay to arrange a virtual visit. Please notify patient. Thanks, Dr. Amalia Hailey

## 2022-04-19 ENCOUNTER — Telehealth: Payer: Self-pay | Admitting: Podiatry

## 2022-04-19 NOTE — Telephone Encounter (Signed)
error 

## 2022-04-19 NOTE — Telephone Encounter (Signed)
Pt is scheduled for the virtual visit on 2.19 at 1130 with Dr Amalia Hailey

## 2022-04-19 NOTE — Telephone Encounter (Signed)
Left message for pt to call to discuss virtual visit on 2.19

## 2022-04-25 ENCOUNTER — Ambulatory Visit (INDEPENDENT_AMBULATORY_CARE_PROVIDER_SITE_OTHER): Payer: No Typology Code available for payment source | Admitting: Podiatry

## 2022-04-25 DIAGNOSIS — M7751 Other enthesopathy of right foot: Secondary | ICD-10-CM

## 2022-04-25 DIAGNOSIS — M7741 Metatarsalgia, right foot: Secondary | ICD-10-CM

## 2022-04-25 NOTE — Progress Notes (Signed)
Chief Complaint  Patient presents with   Routine Post Op    POV# 4  patient is having pain in the ball of the foot and top of the foot behind the 3rd toe, rate of pain 4 out of 10, patient denies any N/V/F/C/SOB,     Subjective:  Spoke with patient via telephone encounter and identifiers utilized to ensure the correct patient.  Patient is status post Weil shortening osteotomy of the second metatarsal right foot.  DOS: 03/01/2022.  Also PSxHx bunionectomy with double osteotomy, hammertoe repair second RT. DOS: 08/19/2021.    Patient states that she continues to have pain and tenderness to the great toe joint/MTP as well as the lesser MTPs around the second, third, and fourth toes.  Patient says that she feels as if she is walking on the inside of her foot.  Patient says that the second toe is dorsiflexed and sticks up higher than the other digits.  No change since an office visit on 04/01/2022.  No past medical history on file.  Allergies  Allergen Reactions   Black Walnut Pollen Allergy Skin Test     Other reaction(s): Edema   Epinephrine     Other reaction(s): Irregular Heart Rate   Fluticasone     Other reaction(s): Migraine   Promethazine     Other reaction(s): Palpitation    Objective/Physical Exam performed on 04/01/2022 Neurovascular status intact.  Skin incisions healed.  Limited range of motion of the second MTP with slight dorsal contracture.  Likely secondary to scar tissue.  There is also pain on palpation to the first and second MTP joints.  Radiographic Exam RT foot 04/01/2022:  Shortening osteotomy site of the second metatarsal with increased joint space noted to the second MTP.  Compared to prior x-rays now there is transverse subluxation of the first MTP with increased hallux abductus angle.  Orthopedic screws are intact.  Assessment: 1. s/p Weil shortening osteotomy second metatarsal right. DOS: 03/01/2022 2. PSxHx bunionectomy with double osteotomy and hammertoe  repair second right.  DOS: 08/19/2021 3.  Recurrent hallux valgus with incongruent first MTP right -Today we discussed the severity of the patient's symptoms and if a another revisional surgery would be warranted.  Unfortunately the patient continues to have pain and tenderness on a daily basis.  She wears her custom molded orthotics which do not seem to help alleviate any of her symptoms.  Prior surgeries the patient has had good healing of the osteotomy sites without postoperative complications.  Unfortunately the prior surgeries have not alleviated her pain or symptoms in the forefoot.  New finding of recurrent hallux valgus also noted last visit on 04/01/2022. -Patient's case was discussed and presented with the other physicians in our group.  After receiving second opinions with the other physicians and discussion with the patient she is amenable to an arthrodesis of the first MTP.  I do believe that arthrodesis of the first MTP would permanently alleviate her bunion deformity and pain coming from this area.  Also a capsulotomy of the second MTP and an attempt to plantarflex the toe and better alignment of the remaining digits.  Although she does have pain and tenderness associated to the third and fourth digits I would not recommend doing anything surgically.  I do not believe surgery would benefit the patient and resolve the pain and tenderness to the third and fourth digits.  Risk benefits advantages and disadvantages as well as the postoperative recovery course were explained in detail to  the patient.  Patient understands would like to proceed with surgery -Verbal surgical consent was obtained today which includes first MTP arthrodesis, removal of hardware, and capsulotomy of the second MTP right foot.  Consents can be signed physically morning of surgery. -Recommend that the patient reaches out to me directly via MyChart if she has any questions or concerns. -Will plan for return to clinic 1 week  postop  Edrick Kins, DPM [facility]  Dr. Edrick Kins, DPM    [address], [place] [address]                Office [phone]  Fax [phone]

## 2022-04-29 ENCOUNTER — Encounter: Payer: Self-pay | Admitting: Podiatry

## 2022-06-02 ENCOUNTER — Other Ambulatory Visit: Payer: Self-pay | Admitting: Podiatry

## 2022-06-02 DIAGNOSIS — M2011 Hallux valgus (acquired), right foot: Secondary | ICD-10-CM | POA: Diagnosis not present

## 2022-06-02 DIAGNOSIS — M2041 Other hammer toe(s) (acquired), right foot: Secondary | ICD-10-CM | POA: Diagnosis not present

## 2022-06-02 DIAGNOSIS — M21541 Acquired clubfoot, right foot: Secondary | ICD-10-CM | POA: Diagnosis not present

## 2022-06-02 MED ORDER — DICLOFENAC SODIUM 75 MG PO TBEC: DELAYED_RELEASE_TABLET | ORAL | 1 refills | Status: DC

## 2022-06-02 MED ORDER — OXYCODONE-ACETAMINOPHEN 5-325 MG PO TABS: 1.0000 | ORAL_TABLET | ORAL | 0 refills | Status: DC | PRN

## 2022-06-02 NOTE — Progress Notes (Signed)
PRN postop 

## 2022-06-03 ENCOUNTER — Other Ambulatory Visit: Payer: Self-pay | Admitting: Podiatry

## 2022-06-03 MED ORDER — HYDROCODONE-ACETAMINOPHEN 10-325 MG PO TABS: 1.0000 | ORAL_TABLET | Freq: Three times a day (TID) | ORAL | 0 refills | Status: DC | PRN

## 2022-06-03 MED ORDER — CYCLOBENZAPRINE HCL 10 MG PO TABS: 10.0000 mg | ORAL_TABLET | Freq: Three times a day (TID) | ORAL | 0 refills | Status: DC | PRN

## 2022-06-08 ENCOUNTER — Ambulatory Visit (INDEPENDENT_AMBULATORY_CARE_PROVIDER_SITE_OTHER): Payer: No Typology Code available for payment source

## 2022-06-08 ENCOUNTER — Ambulatory Visit (INDEPENDENT_AMBULATORY_CARE_PROVIDER_SITE_OTHER): Payer: No Typology Code available for payment source | Admitting: Podiatry

## 2022-06-08 DIAGNOSIS — M2011 Hallux valgus (acquired), right foot: Secondary | ICD-10-CM

## 2022-06-08 DIAGNOSIS — Z9889 Other specified postprocedural states: Secondary | ICD-10-CM

## 2022-06-08 NOTE — Progress Notes (Signed)
   Chief Complaint  Patient presents with   Routine Post Op    POV # 1 DOS 06/02/22 --- REMOVAL ORTHOPEDIC SCREWS RIGHT FOOT, RIGHT GREAT TOE ARTHRODESIS, MPJ CAPSULOTOMY 2ND DIGIT RIGHT FOOT    Subjective:  Patient presents today status post right first MTP arthrodesis with capsulotomy of the second digit right foot and hammertoe arthroplasty with MTP capsulotomy and Weil shortening osteotomy of the third digit right foot.  Patient doing well.  Dressings clean dry and intact.  She is currently WBAT with the assistance of a knee scooter to the surgical extremity.  No past medical history on file.   Allergies  Allergen Reactions   Black Walnut Pollen Allergy Skin Test     Other reaction(s): Edema   Epinephrine     Other reaction(s): Irregular Heart Rate   Fluticasone     Other reaction(s): Migraine   Promethazine     Other reaction(s): Palpitation    Objective/Physical Exam Neurovascular status intact.  Incision well coapted with sutures intact. No sign of infectious process noted. No dehiscence. No active bleeding noted.  Moderate edema noted to the surgical extremity.  Radiographic Exam RT foot 06/08/2022:  Orthopedic stable noted across the first MTP.  Orthopedic screws to the distal heads of the second and third metatarsal.  Toes in good alignment.    Assessment: 1. s/p right first MTP arthrodesis.  Second MTP capsulotomy.  Third toe hammertoe arthroplasty with Weil shortening osteotomy. DOS: 06/02/2022   Plan of Care:  1. Patient was evaluated. X-rays reviewed 2.  Dressings changed.  Clean dry and intact x 1 week 3.  Continue minimal WBAT in the cam boot with the assistance of the knee scooter 4.  Return to clinic 1 week   Edrick Kins, DPM [facility]  Dr. Edrick Kins, DPM    [address], [place] [address]                Office [phone]  Fax [phone]

## 2022-06-09 ENCOUNTER — Other Ambulatory Visit: Payer: Self-pay | Admitting: Podiatry

## 2022-06-13 ENCOUNTER — Ambulatory Visit (INDEPENDENT_AMBULATORY_CARE_PROVIDER_SITE_OTHER): Payer: No Typology Code available for payment source | Admitting: Podiatry

## 2022-06-13 DIAGNOSIS — Z9889 Other specified postprocedural states: Secondary | ICD-10-CM

## 2022-06-13 MED ORDER — DOXYCYCLINE HYCLATE 100 MG PO TABS: 100.0000 mg | ORAL_TABLET | Freq: Two times a day (BID) | ORAL | 0 refills | Status: DC

## 2022-06-13 MED ORDER — HYDROCODONE-ACETAMINOPHEN 5-325 MG PO TABS: 1.0000 | ORAL_TABLET | Freq: Four times a day (QID) | ORAL | 0 refills | Status: DC | PRN

## 2022-06-13 NOTE — Progress Notes (Addendum)
   Chief Complaint  Patient presents with   Routine Post Op    POV # 2 DOS 06/02/22 --- REMOVAL ORTHOPEDIC SCREWS RIGHT FOOT, RIGHT GREAT TOE ARTHRODESIS, MPJ CAPSULOTOMY 2ND DIGIT RIGHT FOOT,patient  hallux is swollen and red has some bleeding and drainage     Subjective:  Patient presents today status post right first MTP arthrodesis with capsulotomy of the second digit right foot and hammertoe arthroplasty with MTP capsulotomy and Weil shortening osteotomy of the third digit right foot.  DOS: 06/02/2022.  Patient is doing well and she has been WBAT in the cam boot with the assistance of the knee scooter for offloading.  Presenting for further treatment evaluation  No past medical history on file.   Allergies  Allergen Reactions   Black Walnut Pollen Allergy Skin Test     Other reaction(s): Edema   Epinephrine     Other reaction(s): Irregular Heart Rate   Fluticasone     Other reaction(s): Migraine   Promethazine     Other reaction(s): Palpitation    Objective/Physical Exam Neurovascular status intact.  Incisions coapted.  There is some increased edema noted this week compared to prior week.  Patient does admit to ambulating on her foot more this week.  Radiographic Exam RT foot 06/08/2022:  Orthopedic hardware stable across the first MTP.  Good apposition of the arthrodesis site.  Orthopedic screws to the distal heads of the second and third metatarsal.  Toes in good alignment.    Assessment: 1. s/p right first MTP arthrodesis.  Second MTP capsulotomy.  Third toe hammertoe arthroplasty with Weil shortening osteotomy. DOS: 06/02/2022   Plan of Care:  1. Patient was evaluated.  2.  Sutures left intact today 3.  Stressed the importance of elevating the foot, icing, and reducing activity to allow for the edema to improve 4.  Continue minimal WBAT cam boot with the assistance of a knee scooter 5.  Refill prescription for Vicodin 5/325 mg every 6 hours as needed pain  6.  Due to the  increased edema this week I did call in a prescription for doxycycline 100 mg 2 times daily #20.  Although I do not believe there is truly cellulitis/infection I would rather decide  7.  Return to clinic 1 week suture removal   Felecia Shelling, DPM [facility]  Dr. Felecia Shelling, DPM    [address] [place], [place] [address]                Office [phone]  Fax [phone]

## 2022-06-15 ENCOUNTER — Encounter: Payer: No Typology Code available for payment source | Admitting: Podiatry

## 2022-06-17 ENCOUNTER — Telehealth: Payer: Self-pay | Admitting: Podiatry

## 2022-06-17 ENCOUNTER — Other Ambulatory Visit: Payer: Self-pay | Admitting: Podiatry

## 2022-06-17 DIAGNOSIS — M2011 Hallux valgus (acquired), right foot: Secondary | ICD-10-CM

## 2022-06-17 DIAGNOSIS — Z9889 Other specified postprocedural states: Secondary | ICD-10-CM

## 2022-06-17 NOTE — Telephone Encounter (Signed)
Patient called and stated that she changed her bandage as directed every other day and today when she changed it pulled some of the skin up from one of the stitches and started bleeding.  Bleeding has stopped.  She says there is no signs of infection.  Still taking doxycycline.  I advised her to place Steri-Strips across the bleeding area and can continue dressing changes, use nonstick gauze and saturated with saline prior to changing so this does not happen again.  She will keep her appointment next week and she will let us know if anything gets worse before then

## 2022-06-18 ENCOUNTER — Encounter: Payer: Self-pay | Admitting: Podiatry

## 2022-06-20 ENCOUNTER — Ambulatory Visit (INDEPENDENT_AMBULATORY_CARE_PROVIDER_SITE_OTHER): Payer: No Typology Code available for payment source | Admitting: Podiatry

## 2022-06-20 ENCOUNTER — Telehealth: Payer: Self-pay | Admitting: Podiatry

## 2022-06-20 ENCOUNTER — Encounter: Payer: Self-pay | Admitting: Podiatry

## 2022-06-20 DIAGNOSIS — Z9889 Other specified postprocedural states: Secondary | ICD-10-CM

## 2022-06-20 NOTE — Telephone Encounter (Signed)
PT called in informing me that her incision area is not open with fluid coming out and she spoke with the on call Dr and wants to be seen today. I offered her an appointment today at 915 and pt stated she couldn't come in because of lack of transportation. Pt  was transferred to Nurse triage for further assistance.   Please advise

## 2022-06-20 NOTE — Telephone Encounter (Signed)
Patient has been seen today

## 2022-06-20 NOTE — Progress Notes (Signed)
   Chief Complaint  Patient presents with   Routine Post Op    DOS 06/02/22 --- REMOVAL ORTHOPEDIC SCREWS RIGHT FOOT, RIGHT GREAT TOE ARTHRODESIS, MPJ CAPSULOTOMY 2ND DIGIT RIGHT FOOT    "It looks like the incision has opened up some, draining a little. I've been taking my antibiotics. Seems like the leg is swollen"    Subjective:  Patient presents today status post right first MTP arthrodesis with capsulotomy of the second digit right foot and hammertoe arthroplasty with MTP capsulotomy and Weil shortening osteotomy of the third digit right foot.  DOS: 06/02/2022.  Patient states that she noticed over the weekend some slight drainage coming from one of the incision sites.  She was concerned that the incision site was breaking open.  Presenting today as an urgent work in for evaluation  No past medical history on file.   Allergies  Allergen Reactions   Black Walnut Pollen Allergy Skin Test     Other reaction(s): Edema   Epinephrine     Other reaction(s): Irregular Heart Rate   Fluticasone     Other reaction(s): Migraine   Promethazine     Other reaction(s): Palpitation    Objective/Physical Exam Neurovascular status intact.  Incisions coapted with exception of a small focal area along the proximal portion of the incision site overlying the first MTP.  Slight serous drainage.  No purulence.  Edema noted to the foot.  No erythema today.  Pulses are palpable.  The calf is soft and supple with no pain to the muscle belly.  I do not suspect DVT.  Radiographic Exam RT foot 06/08/2022:  Orthopedic hardware stable across the first MTP.  Good apposition of the arthrodesis site.  Orthopedic screws to the distal heads of the second and third metatarsal.  Toes in good alignment.    Assessment: 1. s/p right first MTP arthrodesis.  Second MTP capsulotomy.  Third toe hammertoe arthroplasty with Weil shortening osteotomy. DOS: 06/02/2022  -Patient evaluated -Sutures removed from the second and third  MTP incision sites.  The majority of the suture along the first MTP was left intact today -Due to the concern for increased swelling I did apply a multilayer Unna boot compression wrap to the right foot and ankle.  Leave clean dry and intact until next visit -Continue WBAT cam boot -Patient has follow-up appointment this Friday, 06/24/2022     Felecia Shelling, DPM [facility]  Dr. Felecia Shelling, DPM    [address] [place], [place] [address]                Office [phone]  Fax [phone]

## 2022-06-21 ENCOUNTER — Encounter: Payer: Self-pay | Admitting: Podiatry

## 2022-06-22 ENCOUNTER — Telehealth: Payer: Self-pay | Admitting: Podiatry

## 2022-06-22 ENCOUNTER — Other Ambulatory Visit: Payer: Self-pay | Admitting: Podiatry

## 2022-06-22 MED ORDER — HYDROCODONE-ACETAMINOPHEN 5-325 MG PO TABS: 1.0000 | ORAL_TABLET | Freq: Four times a day (QID) | ORAL | 0 refills | Status: DC | PRN

## 2022-06-22 NOTE — Telephone Encounter (Signed)
Patient is calling stating her right foot is in severe pain. She is requesting pain meds

## 2022-06-22 NOTE — Progress Notes (Signed)
PRN postop 

## 2022-06-24 ENCOUNTER — Ambulatory Visit (INDEPENDENT_AMBULATORY_CARE_PROVIDER_SITE_OTHER): Payer: No Typology Code available for payment source

## 2022-06-24 ENCOUNTER — Ambulatory Visit (INDEPENDENT_AMBULATORY_CARE_PROVIDER_SITE_OTHER): Payer: No Typology Code available for payment source | Admitting: Podiatry

## 2022-06-24 DIAGNOSIS — M2011 Hallux valgus (acquired), right foot: Secondary | ICD-10-CM

## 2022-06-24 DIAGNOSIS — Z9889 Other specified postprocedural states: Secondary | ICD-10-CM

## 2022-06-24 NOTE — Progress Notes (Signed)
   Chief Complaint  Patient presents with   Routine Post Op    POV # 3 DOS 06/02/22 --- REMOVAL ORTHOPEDIC SCREWS RIGHT FOOT, RIGHT GREAT TOE ARTHRODESIS, MPJ CAPSULOTOMY 2ND DIGIT RIGHT FOOT    Subjective:  Patient presents today status post right first MTP arthrodesis with capsulotomy of the second digit right foot and hammertoe arthroplasty with MTP capsulotomy and Weil shortening osteotomy of the third digit right foot.  DOS: 06/02/2022.  Patient doing well.  She tolerated the [employer] well.  She has noticed a significant reduction of edema.  No past medical history on file.   Allergies  Allergen Reactions   Black Walnut Pollen Allergy Skin Test     Other reaction(s): Edema   Epinephrine     Other reaction(s): Irregular Heart Rate   Fluticasone     Other reaction(s): Migraine   Promethazine     Other reaction(s): Palpitation    Objective/Physical Exam Neurovascular status intact.  All incisions nicely healed.  Remaining sutures are well coapted with sutures intact.  No drainage.  There is a small focal area of eschar approximately a 1 cm in length along the medial incision site of the foot.  Appears very stable.  No drainage.  No malodor.  Clinically not appear to be any underlying cellulitis or infection  Radiographic Exam RT foot 06/24/2022:  No change since prior x-rays.  Orthopedic hardware stable across the first MTP.  Good apposition of the arthrodesis site.  Orthopedic screws to the distal heads of the second and third metatarsal.  Toes in good alignment.    Assessment: 1. s/p right first MTP arthrodesis.  Second MTP capsulotomy.  Third toe hammertoe arthroplasty with Weil shortening osteotomy. DOS: 06/02/2022  -Patient evaluated - Remaining sutures removed -Overall there is significant improvement and reduction of the edema and erythema.  Multilayer Unna boot compression wrap was reapplied today to leave on over the weekend -Continue minimal WBAT cam boot -Return to  clinic 2 weeks     Felecia Shelling, DPM [facility]  Dr. Felecia Shelling, DPM    [address] [place], [place] [address]                Office [phone]  Fax [phone]

## 2022-06-29 ENCOUNTER — Encounter: Payer: No Typology Code available for payment source | Admitting: Podiatry

## 2022-06-30 ENCOUNTER — Encounter: Payer: Self-pay | Admitting: Podiatry

## 2022-07-01 ENCOUNTER — Other Ambulatory Visit: Payer: Self-pay | Admitting: Podiatry

## 2022-07-01 MED ORDER — HYDROCODONE-ACETAMINOPHEN 5-325 MG PO TABS: 1.0000 | ORAL_TABLET | Freq: Four times a day (QID) | ORAL | 0 refills | Status: DC | PRN

## 2022-07-01 MED ORDER — DOXYCYCLINE HYCLATE 100 MG PO TABS: 100.0000 mg | ORAL_TABLET | Freq: Two times a day (BID) | ORAL | 0 refills | Status: DC

## 2022-07-05 ENCOUNTER — Other Ambulatory Visit: Payer: Self-pay | Admitting: Podiatry

## 2022-07-05 DIAGNOSIS — M2011 Hallux valgus (acquired), right foot: Secondary | ICD-10-CM

## 2022-07-05 DIAGNOSIS — Z9889 Other specified postprocedural states: Secondary | ICD-10-CM

## 2022-07-08 ENCOUNTER — Encounter: Payer: No Typology Code available for payment source | Admitting: Podiatry

## 2022-07-11 ENCOUNTER — Ambulatory Visit (INDEPENDENT_AMBULATORY_CARE_PROVIDER_SITE_OTHER): Payer: No Typology Code available for payment source | Admitting: Podiatry

## 2022-07-11 ENCOUNTER — Ambulatory Visit (INDEPENDENT_AMBULATORY_CARE_PROVIDER_SITE_OTHER): Payer: Non-veteran care

## 2022-07-11 DIAGNOSIS — Z9889 Other specified postprocedural states: Secondary | ICD-10-CM | POA: Diagnosis not present

## 2022-07-11 MED ORDER — HYDROCODONE-ACETAMINOPHEN 5-325 MG PO TABS: 1.0000 | ORAL_TABLET | Freq: Four times a day (QID) | ORAL | 0 refills | Status: DC | PRN

## 2022-07-11 NOTE — Progress Notes (Signed)
   Chief Complaint  Patient presents with   Routine Post Op    POV # 4 DOS 06/02/22 --- REMOVAL ORTHOPEDIC SCREWS RIGHT FOOT, RIGHT GREAT TOE ARTHRODESIS, MPJ CAPSULOTOMY 2ND DIGIT RIGHT FOOT    Subjective:  Patient presents today status post right first MTP arthrodesis with capsulotomy of the second digit right foot and hammertoe arthroplasty with MTP capsulotomy and Weil shortening osteotomy of the third digit right foot.  DOS: 06/02/2022.  Patient is doing well.  She continues to take the pain medication as needed.  No new complaints at this time.  She has been WBAT in the cam boot as instructed  No past medical history on file.   Allergies  Allergen Reactions   Black Walnut Pollen Allergy Skin Test     Other reaction(s): Edema   Epinephrine     Other reaction(s): Irregular Heart Rate   Fluticasone     Other reaction(s): Migraine   Promethazine     Other reaction(s): Palpitation    Objective/Physical Exam Neurovascular status intact.  All incisions nicely healed.  Mild to moderate edema noted.  Appears very stable.  Good rectus alignment of the toes  Radiographic Exam RT foot 07/11/2022:  Essentially unchanged.  Orthopedic hardware stable across the first MTP.  Good apposition of the arthrodesis site.  Orthopedic screws to the distal heads of the second and third metatarsal.  Toes in good alignment.    Assessment: 1. s/p right first MTP arthrodesis.  Second MTP capsulotomy.  Third toe hammertoe arthroplasty with Weil shortening osteotomy. DOS: 06/02/2022  -Patient evaluated.  X-rays reviewed today - Percutaneous fixation pin removed from third toe -Patient may now transition out of the cam boot into his good supportive tennis shoes and sneakers -OTC prefabricated PowerStep arch supports were provided for the patient -Continue daily range of motion exercises to the second and third MTP to reduce scar tissue adhesion -Return to clinic 6 weeks follow-up x-ray    Felecia Shelling,  DPM [facility]  Dr. Felecia Shelling, DPM    [address] [place], [place] [address]                Office [phone]  Fax [phone]

## 2022-07-13 ENCOUNTER — Encounter: Payer: Self-pay | Admitting: Podiatry

## 2022-07-19 ENCOUNTER — Other Ambulatory Visit: Payer: Self-pay | Admitting: Podiatry

## 2022-07-19 DIAGNOSIS — Z9889 Other specified postprocedural states: Secondary | ICD-10-CM

## 2022-07-21 ENCOUNTER — Telehealth: Payer: Self-pay | Admitting: Podiatry

## 2022-07-21 ENCOUNTER — Other Ambulatory Visit: Payer: Self-pay | Admitting: Podiatry

## 2022-07-21 ENCOUNTER — Encounter: Payer: Self-pay | Admitting: Podiatry

## 2022-07-21 MED ORDER — HYDROCODONE-ACETAMINOPHEN 7.5-325 MG PO TABS: 1.0000 | ORAL_TABLET | Freq: Four times a day (QID) | ORAL | 0 refills | Status: DC | PRN

## 2022-07-21 NOTE — Progress Notes (Signed)
As needed postop pain.  Felecia Shelling, DPM [facility]  Dr. Felecia Shelling, DPM    [address] [place], [place] [address]                Office [phone]  Fax [phone]

## 2022-07-21 NOTE — Telephone Encounter (Signed)
Pt called stating she sent Dr Logan Bores a message for a refill on her pain medication 2 days ago and had not heard back.Marland Kitchen  Upon checking the medication was sent in today and pt is aware.She said thank you so much.

## 2022-07-24 ENCOUNTER — Encounter: Payer: Self-pay | Admitting: Podiatry

## 2022-07-25 ENCOUNTER — Telehealth: Payer: Self-pay | Admitting: Podiatry

## 2022-07-25 ENCOUNTER — Other Ambulatory Visit: Payer: Self-pay | Admitting: Podiatry

## 2022-07-25 MED ORDER — OXYCODONE-ACETAMINOPHEN 5-325 MG PO TABS: 1.0000 | ORAL_TABLET | ORAL | 0 refills | Status: DC | PRN

## 2022-07-25 MED ORDER — DOXYCYCLINE HYCLATE 100 MG PO TABS: 100.0000 mg | ORAL_TABLET | Freq: Two times a day (BID) | ORAL | 0 refills | Status: DC

## 2022-07-25 NOTE — Progress Notes (Signed)
Spoke with patient on the phone. Increased swelling and redness surgical foot. F/u work-in appt tomorrow in [place] office.   Felecia Shelling, DPM [facility]  Dr. Felecia Shelling, DPM    [address] [place], [place] [address]                Office [phone]  Fax [phone]

## 2022-07-25 NOTE — Telephone Encounter (Signed)
Left message stating that pics were sent via mychart and wanted to make sure they were seen. Please call

## 2022-07-26 ENCOUNTER — Encounter: Payer: Self-pay | Admitting: Podiatry

## 2022-07-26 ENCOUNTER — Ambulatory Visit (INDEPENDENT_AMBULATORY_CARE_PROVIDER_SITE_OTHER): Payer: Non-veteran care | Admitting: Podiatry

## 2022-07-26 ENCOUNTER — Ambulatory Visit (INDEPENDENT_AMBULATORY_CARE_PROVIDER_SITE_OTHER): Payer: Non-veteran care

## 2022-07-26 ENCOUNTER — Ambulatory Visit (AMBULATORY_SURGERY_CENTER): Payer: Non-veteran care

## 2022-07-26 VITALS — Ht 67.0 in | Wt 174.0 lb

## 2022-07-26 VITALS — BP 116/71 | HR 84

## 2022-07-26 DIAGNOSIS — Z9889 Other specified postprocedural states: Secondary | ICD-10-CM | POA: Diagnosis not present

## 2022-07-26 DIAGNOSIS — Z1211 Encounter for screening for malignant neoplasm of colon: Secondary | ICD-10-CM

## 2022-07-26 DIAGNOSIS — R600 Localized edema: Secondary | ICD-10-CM | POA: Diagnosis not present

## 2022-07-26 MED ORDER — PEG 3350-KCL-NA BICARB-NACL 420 G PO SOLR: 4000.0000 mL | Freq: Once | ORAL | 0 refills | Status: AC

## 2022-07-26 NOTE — Progress Notes (Signed)
Chief Complaint  Patient presents with   Post-op Problem    "It's not good.  I been in a lot of pain."    Subjective:  Patient presents today status post right first MTP arthrodesis with capsulotomy of the second digit right foot and hammertoe arthroplasty with MTP capsulotomy and Weil shortening osteotomy of the third digit right foot.  DOS: 06/02/2022.  WBAT in the cam boot.  Over the past week the patient has noticed a significant amount of edema and swelling to the surgical foot.  After the first week postop she did begin to develop some swelling and edema when she returned to work and began ambulating in the cam boot however the swelling became increasingly worse especially over the past week.  Yesterday antibiotics were sent to the pharmacy and she has been taking them.  Today the swelling is not as severe since she has been able to elevate and rest her foot today.  Past Medical History:  Diagnosis Date   Anxiety    on meds   Arthritis    generalized   Depression    on meds   GERD (gastroesophageal reflux disease)    on meds   Melanoma (HCC)    Seasonal allergies    Substance abuse (HCC)    currently taken preventative medications   Thyroid disease    on meds     Allergies  Allergen Reactions   Black Walnut Pollen Allergy Skin Test     Other reaction(s): Edema   Epinephrine     Other reaction(s): Irregular Heart Rate   Walnut     Other Reaction(s): edema   Fluticasone     Other reaction(s): Migraine   Promethazine     Other reaction(s): Palpitation    Objective/Physical Exam Neurovascular status intact.  All incisions nicely healed.  Mild to moderate edema noted.  Appears very stable.  Good rectus alignment of the toes  Radiographic Exam RT foot 07/11/2022:  Essentially unchanged.  Orthopedic hardware stable across the first MTP.  Good apposition of the arthrodesis site.  Orthopedic screws to the distal heads of the second and third metatarsal.  There does appear  to be some slight lateral deviation of the digits.  There is increased soft tissue edema encompassing the foot compared to prior x-rays  Assessment: 1. s/p right first MTP arthrodesis.  Second MTP capsulotomy.  Third toe hammertoe arthroplasty with Weil shortening osteotomy. DOS: 06/02/2022  -Patient evaluated.  X-rays reviewed today - Due to the concern for the increased edema and swelling, we are going to return to strict nonweightbearing to the surgical extremity in the cam boot with the knee scooter.  Patient agrees -Continue oral antibiotics prescribed.  I do not suspect that this is infection related however I would rather her complete the oral antibiotics just to be safe -Education officer, environmental boot compression wrap was applied to the extremity.  Patient has had good success with Unna boot compression wraps in the past. -Return to clinic weekly for reapplication of Unna boot compression wrap   Felecia Shelling, DPM [facility]  Dr. Felecia Shelling, DPM    [address], [place] [address]                Office [phone]  Fax [phone]

## 2022-07-26 NOTE — Progress Notes (Signed)
Pre visit completed via phone call; Patient verified name, DOB, and address;  No egg or soy allergy known to patient;  No issues known to pt with past sedation with any surgeries or procedures; Patient denies ever being told they had issues or difficulty with intubation;  No FH of Malignant Hyperthermia; Pt is not on diet pills; Pt is not on home 02;  Pt is not on blood thinners;  Pt reports issues with constipation - takes AloeVera powder capsules (only takes as needed)-patient advised to increase activity, fruits/veggies, and can also take OTC stool softeners/or gentle laxatives; No A fib or A flutter; Have any cardiac testing pending--NO Pt instructed to use Singlecare.com or GoodRx for a price reduction on prep;   Insurance verified during PV appt=[place] insurance  Patient's chart reviewed by Cathlyn Parsons CNRA prior to previsit and patient appropriate for the [facility].  Previsit completed and red dot placed by patient's name on their procedure day (on provider's schedule).    Instructions sent to patient via MyChart per her request;

## 2022-08-02 ENCOUNTER — Other Ambulatory Visit: Payer: Self-pay | Admitting: Podiatry

## 2022-08-02 ENCOUNTER — Encounter: Payer: Self-pay | Admitting: Podiatry

## 2022-08-02 MED ORDER — HYDROCODONE-ACETAMINOPHEN 7.5-325 MG PO TABS: 1.0000 | ORAL_TABLET | Freq: Four times a day (QID) | ORAL | 0 refills | Status: DC | PRN

## 2022-08-05 ENCOUNTER — Encounter: Payer: Self-pay | Admitting: Podiatry

## 2022-08-05 ENCOUNTER — Ambulatory Visit (INDEPENDENT_AMBULATORY_CARE_PROVIDER_SITE_OTHER): Payer: Non-veteran care | Admitting: Podiatry

## 2022-08-05 VITALS — BP 113/77 | HR 79

## 2022-08-05 DIAGNOSIS — R600 Localized edema: Secondary | ICD-10-CM | POA: Diagnosis not present

## 2022-08-05 DIAGNOSIS — Z9889 Other specified postprocedural states: Secondary | ICD-10-CM

## 2022-08-05 NOTE — Progress Notes (Signed)
   Chief Complaint  Patient presents with   Routine Post Op    "My right foot, still has swelling and pain.  Some of the swelling has come down.  He wanted me to come in for another wrap today"    Subjective:  Patient presents today status post right first MTP arthrodesis with capsulotomy of the second digit right foot and hammertoe arthroplasty with MTP capsulotomy and Weil shortening osteotomy of the third digit right foot.  DOS: 06/02/2022.  Patient states that the swelling has improved significantly since last visit about 10 days ago.  Pain is also improved.  She is currently NWB in the cam boot with the assistance of a knee scooter.  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  Past Medical History:  Diagnosis Date   Anxiety    on meds   Arthritis    generalized   Depression    on meds   GERD (gastroesophageal reflux disease)    on meds   Melanoma (HCC)    Seasonal allergies    Substance abuse (HCC)    currently taken preventative medications   Thyroid disease    on meds     Allergies  Allergen Reactions   Black Walnut Pollen Allergy Skin Test     Other reaction(s): Edema   Epinephrine     Other reaction(s): Irregular Heart Rate   Walnut     Other Reaction(s): edema   Fluticasone     Other reaction(s): Migraine   Promethazine     Other reaction(s): Palpitation    Objective/Physical Exam Neurovascular status intact.  All incisions nicely healed.  There continues to be some mild edema noted however significant improvement.  Appears very stable.  Good rectus alignment of the toes  Radiographic Exam RT foot 07/11/2022:  Essentially unchanged.  Orthopedic hardware stable across the first MTP.  Good apposition of the arthrodesis site.  Orthopedic screws to the distal heads of the second and third metatarsal.  There does appear to be some slight lateral deviation of the digits.  There is increased soft tissue  edema encompassing the foot compared to prior x-rays  Assessment: 1. s/p right first MTP arthrodesis.  Second MTP capsulotomy.  Third toe hammertoe arthroplasty with Weil shortening osteotomy. DOS: 06/02/2022  -Patient evaluated.   - Continue nonweightbearing to the surgical extremity in the cam boot with the knee scooter.  Patient agrees - Patient completed the oral antibiotics -Repeat multilayer Unna boot compression wrap was applied to the extremity.  Patient has had good success with Unna boot compression wraps in the past. - Supply provided for reapplication of Radio broadcast assistant -Return to clinic 2 weeks   Felecia Shelling, DPM [facility]  Dr. Felecia Shelling, DPM    [address] [place], [place] [address]                Office [phone]  Fax [phone]

## 2022-08-10 ENCOUNTER — Other Ambulatory Visit: Payer: Self-pay | Admitting: Podiatry

## 2022-08-12 ENCOUNTER — Telehealth: Payer: Self-pay | Admitting: Podiatry

## 2022-08-12 MED ORDER — HYDROCODONE-ACETAMINOPHEN 7.5-325 MG PO TABS: 1.0000 | ORAL_TABLET | Freq: Four times a day (QID) | ORAL | 0 refills | Status: DC | PRN

## 2022-08-12 NOTE — Telephone Encounter (Signed)
Karen Sampson called again and wants her pain meds sent in before the weekend.  Thank you

## 2022-08-14 ENCOUNTER — Encounter: Payer: Self-pay | Admitting: Gastroenterology

## 2022-08-16 ENCOUNTER — Other Ambulatory Visit: Payer: Self-pay

## 2022-08-16 MED ORDER — DIAZEPAM 5 MG PO TABS: 5.0000 mg | ORAL_TABLET | Freq: Once | ORAL | 0 refills | Status: AC

## 2022-08-19 ENCOUNTER — Encounter: Payer: Self-pay | Admitting: Gastroenterology

## 2022-08-19 ENCOUNTER — Ambulatory Visit (AMBULATORY_SURGERY_CENTER): Payer: No Typology Code available for payment source | Admitting: Gastroenterology

## 2022-08-19 VITALS — BP 122/75 | HR 67 | Temp 98.0°F | Resp 14 | Ht 67.0 in | Wt 174.0 lb

## 2022-08-19 DIAGNOSIS — Z1211 Encounter for screening for malignant neoplasm of colon: Secondary | ICD-10-CM | POA: Diagnosis present

## 2022-08-19 DIAGNOSIS — D124 Benign neoplasm of descending colon: Secondary | ICD-10-CM | POA: Diagnosis not present

## 2022-08-19 MED ORDER — SODIUM CHLORIDE 0.9 % IV SOLN
500.0000 mL | Freq: Once | INTRAVENOUS | Status: DC
Start: 2022-08-19 — End: 2022-08-19

## 2022-08-19 NOTE — Patient Instructions (Signed)
Thank you for coming in to see [place] today! Resume your diet and medications today. Return to regular daily activities tomorrow. Biopsy results will be available in 1-2 weeks at which time recommendation will be made for a surveillance colonoscopy.   YOU HAD AN ENDOSCOPIC PROCEDURE TODAY AT THE [facility]:   Refer to the procedure report that was given to you for any specific questions about what was found during the examination.  If the procedure report does not answer your questions, please call your gastroenterologist to clarify.  If you requested that your care partner not be given the details of your procedure findings, then the procedure report has been included in a sealed envelope for you to review at your convenience later.  YOU SHOULD EXPECT: Some feelings of bloating in the abdomen. Passage of more gas than usual.  Walking can help get rid of the air that was put into your GI tract during the procedure and reduce the bloating. If you had a lower endoscopy (such as a colonoscopy or flexible sigmoidoscopy) you may notice spotting of blood in your stool or on the toilet paper. If you underwent a bowel prep for your procedure, you may not have a normal bowel movement for a few days.  Please Note:  You might notice some irritation and congestion in your nose or some drainage.  This is from the oxygen used during your procedure.  There is no need for concern and it should clear up in a day or so.  SYMPTOMS TO REPORT IMMEDIATELY:  Following lower endoscopy (colonoscopy or flexible sigmoidoscopy):  Excessive amounts of blood in the stool  Significant tenderness or worsening of abdominal pains  Swelling of the abdomen that is new, acute  Fever of 100F or higher   For urgent or emergent issues, a gastroenterologist can be reached at any hour by calling [phone]. Do not use MyChart messaging for urgent concerns.    DIET:  We do recommend a small meal at first, but then you  may proceed to your regular diet.  Drink plenty of fluids but you should avoid alcoholic beverages for 24 hours.  ACTIVITY:  You should plan to take it easy for the rest of today and you should NOT DRIVE or use heavy machinery until tomorrow (because of the sedation medicines used during the test).    FOLLOW UP: Our staff will call the number listed on your records the next business day following your procedure.  We will call around 7:15- 8:00 am to check on you and address any questions or concerns that you may have regarding the information given to you following your procedure. If we do not reach you, we will leave a message.     If any biopsies were taken you will be contacted by phone or by letter within the next 1-3 weeks.  Please call us at [phone] if you have not heard about the biopsies in 3 weeks.    SIGNATURES/CONFIDENTIALITY: You and/or your care partner have signed paperwork which will be entered into your electronic medical record.  These signatures attest to the fact that that the information above on your After Visit Summary has been reviewed and is understood.  Full responsibility of the confidentiality of this discharge information lies with you and/or your care-partner.

## 2022-08-19 NOTE — Progress Notes (Signed)
Called to room to assist during endoscopic procedure.  Patient ID and intended procedure confirmed with present staff. Received instructions for my participation in the procedure from the performing physician.  

## 2022-08-19 NOTE — Progress Notes (Signed)
PT taken to PACU. Monitors in place. VSS. Report given to RN. 

## 2022-08-19 NOTE — Op Note (Signed)
[facility] Patient Name: Karen Sampson Procedure Date: 08/19/2022 8:57 AM MRN: [id number] Endoscopist: Lorin Picket E. Tomasa Rand , MD, [id number] Age: 52 Referring MD:  Date of Birth: June 05, 1970 Gender: Female Account #: [id number] Procedure:                Colonoscopy Indications:              Screening for colorectal malignant neoplasm, This                            is the patient's first colonoscopy Medicines:                Monitored Anesthesia Care Procedure:                Pre-Anesthesia Assessment:                           - Prior to the procedure, a History and Physical                            was performed, and patient medications and                            allergies were reviewed. The patient's tolerance of                            previous anesthesia was also reviewed. The risks                            and benefits of the procedure and the sedation                            options and risks were discussed with the patient.                            All questions were answered, and informed consent                            was obtained. Prior Anticoagulants: The patient has                            taken no anticoagulant or antiplatelet agents. ASA                            Grade Assessment: II - A patient with mild systemic                            disease. After reviewing the risks and benefits,                            the patient was deemed in satisfactory condition to                            undergo the procedure.  After obtaining informed consent, the colonoscope                            was passed under direct vision. Throughout the                            procedure, the patient's blood pressure, pulse, and                            oxygen saturations were monitored continuously. The                            Olympus PCF-H190DL (#[id number]) Colonoscope was                            introduced through  the anus and advanced to the the                            cecum, identified by appendiceal orifice and                            ileocecal valve. The colonoscopy was performed                            without difficulty. The patient tolerated the                            procedure well. The quality of the bowel                            preparation was adequate. The ileocecal valve,                            appendiceal orifice, and rectum were photographed.                            The bowel preparation used was GoLYTELY via split                            dose instruction. Scope In: 9:11:42 AM Scope Out: 9:29:33 AM Scope Withdrawal Time: 0 hours 12 minutes 1 second  Total Procedure Duration: 0 hours 17 minutes 51 seconds  Findings:                 The perianal and digital rectal examinations were                            normal. Pertinent negatives include normal                            sphincter tone and no palpable rectal lesions.                           A 10 mm polyp was found in the distal descending  colon. The polyp was pedunculated. The polyp was                            removed with a cold snare. Resection and retrieval                            were complete. Estimated blood loss was minimal.                           A diffuse area of moderate melanosis was found in                            the entire colon.                           The exam was otherwise normal throughout the                            examined colon.                           Non-bleeding internal hemorrhoids were found during                            retroflexion. The hemorrhoids were Grade I                            (internal hemorrhoids that do not prolapse).                           No additional abnormalities were found on                            retroflexion. Complications:            No immediate complications. Estimated Blood Loss:      Estimated blood loss was minimal. Impression:               - One 10 mm polyp in the distal descending colon,                            removed with a cold snare. Resected and retrieved.                           - Melanosis in the colon.                           - Non-bleeding internal hemorrhoids. Recommendation:           - Patient has a contact number available for                            emergencies. The signs and symptoms of potential                            delayed complications were discussed with the  patient. Return to normal activities tomorrow.                            Written discharge instructions were provided to the                            patient.                           - Resume previous diet.                           - Continue present medications.                           - Await pathology results.                           - Repeat colonoscopy (date not yet determined) for                            surveillance based on pathology results. Aadvik Roker E. Tomasa Rand, MD 08/19/2022 9:35:16 AM This report has been signed electronically.

## 2022-08-19 NOTE — Progress Notes (Signed)
VS completed by CW   Pt's states no medical or surgical changes since previsit or office visit.  

## 2022-08-19 NOTE — Progress Notes (Signed)
[facility] History and Physical   Primary Care Physician:  System, Provider Not In   Reason for Procedure:   Colon cancer screening  Plan:    Screening colonoscopy     HPI: Karen Sampson is a 52 y.o. female undergoing initial average risk screening colonoscopy.  She has no family history of colon cancer and no chronic GI symptoms other than constipation.    Past Medical History:  Diagnosis Date   Anxiety    on meds   Arthritis    generalized   Depression    on meds   GERD (gastroesophageal reflux disease)    on meds   Melanoma (HCC)    Seasonal allergies    Substance abuse (HCC)    currently taken preventative medications   Thyroid disease    on meds    Past Surgical History:  Procedure Laterality Date   CESAREAN SECTION  2008   FOOT SURGERY  08/2021   x 3 surgeries total   TONSILLECTOMY  1989   WISDOM TOOTH EXTRACTION  1989    Prior to Admission medications   Medication Sig Start Date End Date Taking? Authorizing Provider  buPROPion (WELLBUTRIN XL) 150 MG 24 hr tablet Take 150 mg by mouth daily. 06/21/22 03/24/23 Yes [provider]  Cholecalciferol 50 MCG (2000 UT) TABS Take 1 tablet by mouth daily. 06/21/22 05/19/23 Yes [provider]  diazepam (VALIUM) 5 MG tablet SMARTSIG:1 Tablet(s) By Mouth 08/16/22  Yes [provider]  escitalopram (LEXAPRO) 20 MG tablet TAKE ONE TABLET BY MOUTH EVERY DAY FOR ANXIETY AND DEPRESSION 05/26/22 11/11/22 Yes [provider]  HYDROcodone-acetaminophen (NORCO) 7.5-325 MG tablet Take 1 tablet by mouth every 6 (six) hours as needed for moderate pain. 08/12/22  Yes Felecia Shelling, DPM  levothyroxine (SYNTHROID) 125 MCG tablet Take 125 mcg by mouth daily before breakfast.   Yes [provider]  pantoprazole (PROTONIX) 20 MG tablet Take 20 mg by mouth daily. 06/21/22 10/08/22 Yes [provider]  butalbital-acetaminophen-caffeine (FIORICET) 50-325-40 MG tablet Take 1 tablet by  mouth every 6 (six) hours as needed for migraine or headache. 06/21/22 11/20/22  [provider]  celecoxib (CELEBREX) 200 MG capsule Take 200 mg by mouth daily. 08/05/22   [provider]  cyclobenzaprine (FLEXERIL) 10 MG tablet Take 1 tablet (10 mg total) by mouth 3 (three) times daily as needed for muscle spasms. 06/03/22   Louann Sjogren, DPM  diclofenac (VOLTAREN) 75 MG EC tablet TAKE ONE TABLET BY MOUTH TWICE DAILY AS NEEDED PAIN 06/02/22   Felecia Shelling, DPM  doxycycline (VIBRAMYCIN) 100 MG capsule Take 100 mg by mouth 2 (two) times daily. Patient not taking: Reported on 08/19/2022 07/01/22   [provider]  hydrOXYzine (ATARAX) 10 MG tablet Take 10 mg by mouth 3 (three) times daily as needed. 06/21/22 11/11/22  [provider]  naltrexone (DEPADE) 50 MG tablet  09/21/21 09/22/22  [provider]  predniSONE (DELTASONE) 10 MG tablet Take by mouth. 08/05/22   [provider]  Semaglutide (OZEMPIC, 0.25 OR 0.5 MG/DOSE, [place]) Inject 1 Dose into the skin once a week.    [provider]    Current Outpatient Medications  Medication Sig Dispense Refill   buPROPion (WELLBUTRIN XL) 150 MG 24 hr tablet Take 150 mg by mouth daily.     Cholecalciferol 50 MCG (2000 UT) TABS Take 1 tablet by mouth daily.     diazepam (VALIUM) 5 MG tablet SMARTSIG:1 Tablet(s) By Mouth  escitalopram (LEXAPRO) 20 MG tablet TAKE ONE TABLET BY MOUTH EVERY DAY FOR ANXIETY AND DEPRESSION     HYDROcodone-acetaminophen (NORCO) 7.5-325 MG tablet Take 1 tablet by mouth every 6 (six) hours as needed for moderate pain. 30 tablet 0   levothyroxine (SYNTHROID) 125 MCG tablet Take 125 mcg by mouth daily before breakfast.     pantoprazole (PROTONIX) 20 MG tablet Take 20 mg by mouth daily.     butalbital-acetaminophen-caffeine (FIORICET) 50-325-40 MG tablet Take 1 tablet by mouth every 6 (six) hours as needed for migraine or headache.     celecoxib (CELEBREX) 200 MG capsule Take  200 mg by mouth daily.     cyclobenzaprine (FLEXERIL) 10 MG tablet Take 1 tablet (10 mg total) by mouth 3 (three) times daily as needed for muscle spasms. 30 tablet 0   diclofenac (VOLTAREN) 75 MG EC tablet TAKE ONE TABLET BY MOUTH TWICE DAILY AS NEEDED PAIN 60 tablet 1   doxycycline (VIBRAMYCIN) 100 MG capsule Take 100 mg by mouth 2 (two) times daily. (Patient not taking: Reported on 08/19/2022)     hydrOXYzine (ATARAX) 10 MG tablet Take 10 mg by mouth 3 (three) times daily as needed.     naltrexone (DEPADE) 50 MG tablet      predniSONE (DELTASONE) 10 MG tablet Take by mouth.     Semaglutide (OZEMPIC, 0.25 OR 0.5 MG/DOSE, [place]) Inject 1 Dose into the skin once a week.     Current Facility-Administered Medications  Medication Dose Route Frequency Provider Last Rate Last Admin   0.9 %  sodium chloride infusion  500 mL Intravenous Once Jenel Lucks, MD        Allergies as of 08/19/2022 - Review Complete 08/19/2022  Allergen Reaction Noted   Black walnut pollen allergy skin test  12/14/2020   Epinephrine  12/14/2020   Walnut  07/26/2022   Fluticasone  05/27/2014   Promethazine  12/30/1998    Family History  Problem Relation Age of Onset   Colon polyps Neg Hx    Colon cancer Neg Hx    Esophageal cancer Neg Hx    Rectal cancer Neg Hx    Stomach cancer Neg Hx     Social History   Socioeconomic History   Marital status: Unknown    Spouse name: Not on file   Number of children: Not on file   Years of education: Not on file   Highest education level: Not on file  Occupational History   Not on file  Tobacco Use   Smoking status: Never   Smokeless tobacco: Never  Vaping Use   Vaping Use: Never used  Substance and Sexual Activity   Alcohol use: Not Currently    Comment: quit ETOH 09/2021   Drug use: Never   Sexual activity: Not on file  Other Topics Concern   Not on file  Social History Narrative   Not on file   Social Determinants of Health   Financial Resource  Strain: Not on file  Food Insecurity: Not on file  Transportation Needs: Not on file  Physical Activity: Not on file  Stress: Not on file  Social Connections: Not on file  Intimate Partner Violence: Not on file    Review of Systems:  All other review of systems negative except as mentioned in the HPI.  Physical Exam: Vital signs BP 122/75   Pulse 68   Temp 98 F (36.7 C) (Temporal)   Ht 5\' 7"  (1.702 m)   Wt 174 lb (  78.9 kg)   SpO2 100%   BMI 27.25 kg/m   General:   Alert,  Well-developed, well-nourished, pleasant and cooperative in NAD Airway:  Mallampati 1 Lungs:  Clear throughout to auscultation.   Heart:  Regular rate and rhythm; no murmurs, clicks, rubs,  or gallops. Abdomen:  Soft, nontender and nondistended. Normal bowel sounds.   Neuro/Psych:  Normal mood and affect. A and O x 3   Karen Sampson E. Tomasa Rand, MD [facility]

## 2022-08-21 ENCOUNTER — Other Ambulatory Visit: Payer: Self-pay | Admitting: Podiatry

## 2022-08-22 ENCOUNTER — Encounter: Payer: Non-veteran care | Admitting: Podiatry

## 2022-08-22 ENCOUNTER — Telehealth: Payer: Self-pay | Admitting: *Deleted

## 2022-08-22 NOTE — Telephone Encounter (Signed)
Post procedure follow up call placed, no answer and left VM.  

## 2022-08-23 ENCOUNTER — Ambulatory Visit: Payer: Non-veteran care | Admitting: Podiatry

## 2022-08-23 MED ORDER — HYDROCODONE-ACETAMINOPHEN 7.5-325 MG PO TABS: 1.0000 | ORAL_TABLET | Freq: Four times a day (QID) | ORAL | 0 refills | Status: DC | PRN

## 2022-08-25 ENCOUNTER — Encounter: Payer: Self-pay | Admitting: Gastroenterology

## 2022-08-25 NOTE — Progress Notes (Signed)
Ms. Pedraza,  The polyp that I removed during your recent procedure was completely benign but was proven to be a "pre-cancerous" polyp that MAY have grown into cancers if it had not been removed.  Studies shows that at least 20% of women over age 51 and 30% of men over age 75 have pre-cancerous polyps.  Based on current nationally recognized surveillance guidelines, I recommend that you have a repeat colonoscopy in 3 years.   If you develop any new rectal bleeding, abdominal pain or significant bowel habit changes, please contact me before then.

## 2022-09-05 ENCOUNTER — Other Ambulatory Visit: Payer: Self-pay | Admitting: Podiatry

## 2022-09-05 ENCOUNTER — Encounter: Payer: Self-pay | Admitting: Podiatry

## 2022-09-06 MED ORDER — HYDROCODONE-ACETAMINOPHEN 7.5-325 MG PO TABS: 1.0000 | ORAL_TABLET | Freq: Four times a day (QID) | ORAL | 0 refills | Status: DC | PRN

## 2022-09-13 ENCOUNTER — Encounter: Payer: Self-pay | Admitting: Podiatry

## 2022-09-15 ENCOUNTER — Other Ambulatory Visit: Payer: Self-pay | Admitting: Podiatry

## 2022-09-18 MED ORDER — HYDROCODONE-ACETAMINOPHEN 7.5-325 MG PO TABS: 1.0000 | ORAL_TABLET | Freq: Four times a day (QID) | ORAL | 0 refills | Status: DC | PRN

## 2022-09-21 ENCOUNTER — Ambulatory Visit (INDEPENDENT_AMBULATORY_CARE_PROVIDER_SITE_OTHER): Payer: No Typology Code available for payment source

## 2022-09-21 ENCOUNTER — Ambulatory Visit (INDEPENDENT_AMBULATORY_CARE_PROVIDER_SITE_OTHER): Payer: Non-veteran care | Admitting: Podiatry

## 2022-09-21 ENCOUNTER — Encounter: Payer: Self-pay | Admitting: Podiatry

## 2022-09-21 ENCOUNTER — Telehealth: Payer: Self-pay | Admitting: Podiatry

## 2022-09-21 DIAGNOSIS — Z9889 Other specified postprocedural states: Secondary | ICD-10-CM

## 2022-09-21 DIAGNOSIS — M2041 Other hammer toe(s) (acquired), right foot: Secondary | ICD-10-CM

## 2022-09-21 NOTE — Telephone Encounter (Signed)
Dr. Logan Bores wants to know can you see Mrs. Cammarano for a 2nd opinion. Your only available is 10/07/22 which I explained to her. She wants you reach out to be seen earlier.  A good contact number to reach her is [phone].

## 2022-09-22 ENCOUNTER — Other Ambulatory Visit: Payer: Self-pay | Admitting: Podiatry

## 2022-09-23 ENCOUNTER — Telehealth: Payer: Self-pay | Admitting: Podiatry

## 2022-09-23 NOTE — Telephone Encounter (Signed)
Pt states that DR. Evans referred her to you for a second opinion and she needs an appt but you don't have anything until 10/24/2022 can we fit her in somewhere. She said she would do a telephone visit

## 2022-09-26 MED ORDER — HYDROCODONE-ACETAMINOPHEN 7.5-325 MG PO TABS: 1.0000 | ORAL_TABLET | Freq: Four times a day (QID) | ORAL | 0 refills | Status: DC | PRN

## 2022-09-29 ENCOUNTER — Encounter: Payer: Self-pay | Admitting: Podiatry

## 2022-09-29 ENCOUNTER — Ambulatory Visit (INDEPENDENT_AMBULATORY_CARE_PROVIDER_SITE_OTHER): Payer: No Typology Code available for payment source | Admitting: Podiatry

## 2022-09-29 VITALS — BP 111/69 | HR 69

## 2022-09-29 DIAGNOSIS — S92314K Nondisplaced fracture of first metatarsal bone, right foot, subsequent encounter for fracture with nonunion: Secondary | ICD-10-CM

## 2022-09-29 NOTE — Progress Notes (Signed)
   Chief Complaint  Patient presents with   Post-op Problem    "Dr. Logan Bores wants me to get a second opinion with Dr. Ardelle Anton.  I may need another surgery to replace the plate that is in there."    Subjective:  Patient presents today status post right first MTP arthrodesis with capsulotomy of the second digit right foot and hammertoe arthroplasty with MTP capsulotomy and Weil shortening osteotomy of the third digit right foot.  DOS: 06/02/2022.  States that she is still getting a popping sensation.  She has ongoing pain daily basis and is requiring narcotic medication at times.  No recent injuries that she reports.  Objective/Physical Exam General: AAO x3, NAD-presents wearing regular sandals  Dermatological: Incision of the surgery is well-healed.  There is no erythema or warmth.  There is no clinical signs of infection noted.  Vascular: Dorsalis Pedis artery and Posterior Tibial artery pedal pulses are 2/4 bilateral with immedate capillary fill time.  There is no pain with calf compression, swelling, warmth, erythema.   Neruologic: Grossly intact via light touch bilateral.   Musculoskeletal: Underwent edema present to the first MPJ.  Residual hallux abductus is noted.  There is some movement across the arthrodesis site noted today.  There is tenderness palpation of the first MPJ.  Assessment: 1. s/p right first MTP arthrodesis.  Second MTP capsulotomy.  Third toe hammertoe arthroplasty with Weil shortening osteotomy. DOS: 06/02/2022  -Patient evaluated.   -Independently reviewed the prior x-rays and reviewed them with the patient.  There is hardware intact in the first MPJ however nonunion is noted and there is some radiolucency along the first metatarsal head. -Unfortunately the patient is going need revision of the arthrodesis with potential bone graft.  I would like to check blood work including sed rate, CRP, CBC to check for infection.  Also order vitamin D level. -I think she will also  benefit from a bone stimulator given the nonunion.  Will then order this for her as well. -She will follow-up with Dr. Logan Bores.  She likely is not going to be able to do surgery till later in the year.  Will extend her VA authorization as well.  No follow-ups on file.  Vivi Barrack DPM

## 2022-10-03 NOTE — Progress Notes (Signed)
Chief Complaint  Patient presents with   Routine Post Op    "I'm still in a lot of pain.  Will he xray today?"    Subjective:  Patient presents today status post right first MTP arthrodesis with capsulotomy of the second digit right foot and hammertoe arthroplasty with MTP capsulotomy and Weil shortening osteotomy of the third digit right foot.  DOS: 06/02/2022.  Patient has been nonweightbearing in the cam boot with just knee scooter.  She continues to have swelling with pain to the foot.  Past Medical History:  Diagnosis Date   Anxiety    on meds   Arthritis    generalized   Depression    on meds   GERD (gastroesophageal reflux disease)    on meds   Melanoma (HCC)    Seasonal allergies    Substance abuse (HCC)    currently taken preventative medications   Thyroid disease    on meds     Allergies  Allergen Reactions   Black Walnut Pollen Allergy Skin Test     Other reaction(s): Edema   Epinephrine     Other reaction(s): Irregular Heart Rate   Walnut     Other Reaction(s): edema   Fluticasone     Other reaction(s): Migraine   Promethazine     Other reaction(s): Palpitation    Objective/Physical Exam Neurovascular status intact.  All incisions healed.  Persistent edema noted to the foot.  Associated tenderness to the foot as well  Radiographic Exam RT foot 09/21/2022:  There appears to be movement at the level of the MTP and orthopedic hardware.  The compression staple plate does appear to be somewhat dorsally displaced compared to prior x-rays with dorsal movement of the great toe joint compared to prior x-rays.  There continues to be good apposition of the arthrodesis site.   Orthopedic screws to the distal heads of the second and third metatarsal unchanged.  There continues to be some slight lateral deviation of the digits.  There is increased soft tissue edema encompassing the foot compared to prior x-rays  Assessment: 1. s/p right first MTP arthrodesis.  Second  MTP capsulotomy.  Third toe hammertoe arthroplasty with Weil shortening osteotomy. DOS: 06/02/2022 2.  Concern for movement with dorsal malalignment of the great toe at the level of the MTP  -Patient evaluated.  X-rays were reviewed - Continue nonweightbearing to the surgical extremity in the cam boot with the knee scooter.  Patient agrees - Unfortunately the patient has had multiple surgeries to this area with complication after each procedure.  After long discussion with the patient today I do recommend second opinion with another physician in our practice to provide fresh insight regarding revisional surgery versus continued conservative care.  With the movement at the first MTP I am concerned that she may need revisional arthrodesis surgery of the great toe joint. -Return appointment with Dr. Ardelle Anton for second opinion.  Patient understands.  She is still very positive and understanding despite the postoperative complications she has been experiencing  Felecia Shelling, DPM [facility]  Dr. Felecia Shelling, DPM    [address] [place] [address]                Office [phone]  Fax [phone]

## 2022-10-07 ENCOUNTER — Other Ambulatory Visit: Payer: Self-pay | Admitting: Podiatry

## 2022-10-07 ENCOUNTER — Encounter: Payer: Self-pay | Admitting: Podiatry

## 2022-10-10 ENCOUNTER — Other Ambulatory Visit: Payer: Self-pay | Admitting: Podiatry

## 2022-10-10 MED ORDER — HYDROCODONE-ACETAMINOPHEN 10-325 MG PO TABS: 1.0000 | ORAL_TABLET | Freq: Four times a day (QID) | ORAL | 0 refills | Status: DC | PRN

## 2022-10-10 MED ORDER — IBUPROFEN 800 MG PO TABS: 800.0000 mg | ORAL_TABLET | Freq: Three times a day (TID) | ORAL | 1 refills | Status: DC

## 2022-10-10 NOTE — Progress Notes (Signed)
Spoke with patient via telephone today.  Discussed nonunion to the great toe joint arthrodesis and malalignment.  Planning for surgery in November 2024.  In the meantime we will try to manage her pain.  Rest and ice is much as possible with WBAT in the cam boot.  -Prescription for Vicodin 10/3 2 5  mg as needed -Prescription for Motrin 800 mg 3 times daily as needed -Recommend topical Voltaren twice daily as needed -Patient has return appointment 11/14/2022  Felecia Shelling, DPM [facility]  Dr. Felecia Shelling, DPM    [address] [place], [place] [address]                Office [phone]  Fax [phone]

## 2022-10-24 ENCOUNTER — Other Ambulatory Visit: Payer: Self-pay | Admitting: Podiatry

## 2022-10-24 ENCOUNTER — Encounter: Payer: Self-pay | Admitting: Podiatry

## 2022-10-24 MED ORDER — HYDROCODONE-ACETAMINOPHEN 10-325 MG PO TABS: 1.0000 | ORAL_TABLET | Freq: Four times a day (QID) | ORAL | 0 refills | Status: DC | PRN

## 2022-10-24 NOTE — Progress Notes (Signed)
PRN chronic postop pain

## 2022-11-01 ENCOUNTER — Other Ambulatory Visit: Payer: Self-pay | Admitting: Podiatry

## 2022-11-02 MED ORDER — HYDROCODONE-ACETAMINOPHEN 10-325 MG PO TABS: 1.0000 | ORAL_TABLET | Freq: Four times a day (QID) | ORAL | 0 refills | Status: DC | PRN

## 2022-11-10 ENCOUNTER — Encounter: Payer: Self-pay | Admitting: Podiatry

## 2022-11-10 ENCOUNTER — Other Ambulatory Visit: Payer: Self-pay | Admitting: Podiatry

## 2022-11-10 MED ORDER — HYDROCODONE-ACETAMINOPHEN 10-325 MG PO TABS: 1.0000 | ORAL_TABLET | Freq: Four times a day (QID) | ORAL | 0 refills | Status: DC | PRN

## 2022-11-14 ENCOUNTER — Ambulatory Visit: Payer: No Typology Code available for payment source | Admitting: Podiatry

## 2022-11-18 ENCOUNTER — Other Ambulatory Visit: Payer: Self-pay | Admitting: Podiatry

## 2022-11-21 MED ORDER — HYDROCODONE-ACETAMINOPHEN 10-325 MG PO TABS: 1.0000 | ORAL_TABLET | Freq: Four times a day (QID) | ORAL | 0 refills | Status: DC | PRN

## 2022-11-30 ENCOUNTER — Encounter: Payer: Self-pay | Admitting: Podiatry

## 2022-12-05 ENCOUNTER — Other Ambulatory Visit: Payer: Self-pay | Admitting: Podiatry

## 2022-12-05 ENCOUNTER — Telehealth: Payer: Self-pay | Admitting: Podiatry

## 2022-12-05 MED ORDER — HYDROCODONE-ACETAMINOPHEN 10-325 MG PO TABS: 1.0000 | ORAL_TABLET | Freq: Four times a day (QID) | ORAL | 0 refills | Status: AC | PRN

## 2022-12-05 NOTE — Telephone Encounter (Signed)
Rx sent. - Dr. Taren Toops

## 2022-12-05 NOTE — Progress Notes (Signed)
PRN pain 

## 2022-12-05 NOTE — Telephone Encounter (Signed)
Karen Sampson can we please reach out to this patient and get her scheduled for surgery? I'll get you the paperwork on Wednesday. Thanks! -Dr. Logan Bores

## 2022-12-05 NOTE — Telephone Encounter (Signed)
Hi pt messaged you about pain medication refill. She would like to have that filled if possible

## 2022-12-10 ENCOUNTER — Other Ambulatory Visit: Payer: Self-pay | Admitting: Podiatry

## 2022-12-12 ENCOUNTER — Ambulatory Visit (INDEPENDENT_AMBULATORY_CARE_PROVIDER_SITE_OTHER): Payer: Self-pay | Admitting: Podiatry

## 2022-12-12 ENCOUNTER — Telehealth: Payer: Self-pay | Admitting: Podiatry

## 2022-12-12 ENCOUNTER — Ambulatory Visit (INDEPENDENT_AMBULATORY_CARE_PROVIDER_SITE_OTHER): Payer: No Typology Code available for payment source

## 2022-12-12 ENCOUNTER — Encounter: Payer: Self-pay | Admitting: Podiatry

## 2022-12-12 DIAGNOSIS — M2011 Hallux valgus (acquired), right foot: Secondary | ICD-10-CM

## 2022-12-12 DIAGNOSIS — S92314K Nondisplaced fracture of first metatarsal bone, right foot, subsequent encounter for fracture with nonunion: Secondary | ICD-10-CM | POA: Diagnosis not present

## 2022-12-12 DIAGNOSIS — M960 Pseudarthrosis after fusion or arthrodesis: Secondary | ICD-10-CM

## 2022-12-12 MED ORDER — LIDOCAINE 4 % EX PTCH: 1.0000 | MEDICATED_PATCH | CUTANEOUS | 1 refills | Status: AC

## 2022-12-12 MED ORDER — METHYLPREDNISOLONE 4 MG PO TBPK: ORAL_TABLET | ORAL | 0 refills | Status: DC

## 2022-12-12 MED ORDER — HYDROCODONE-ACETAMINOPHEN 10-325 MG PO TABS
1.0000 | ORAL_TABLET | ORAL | 0 refills | Status: AC | PRN
Start: 2022-12-12 — End: 2022-12-19

## 2022-12-12 NOTE — Telephone Encounter (Signed)
Pt called and the pharmacy got 2 of the medications but did not get the lidocaine patches. Could you please sent them in

## 2022-12-12 NOTE — Progress Notes (Signed)
Chief Complaint  Patient presents with   Consult    Discuss surgery right   "Its the whole foot that is hurting"    Subjective:  Patient presents today status post right first MTP arthrodesis with capsulotomy of the second digit right foot and hammertoe arthroplasty with MTP capsulotomy and Weil shortening osteotomy of the third digit right foot.  DOS: 06/02/2022.  Patient continues to have pain and tenderness on a daily basis with weightbearing.  Unfortunately the arthrodesis site to the great toe has shifted postoperatively and in poor alignment causing excessive pain and tenderness especially to the forefoot.  She compensates which is creating pain down to the lateral aspect of the leg and ankle and foot.  Pain is controlled with Vicodin 10/325 mg every 4 hours.  Patient scheduled for removal of hardware with revisional arthrodesis to the right great toe joint the week before Thanksgiving.  Planning to be out of work through the new year.  Past Medical History:  Diagnosis Date   Anxiety    on meds   Arthritis    generalized   Depression    on meds   GERD (gastroesophageal reflux disease)    on meds   Melanoma (HCC)    Seasonal allergies    Substance abuse (HCC)    currently taken preventative medications   Thyroid disease    on meds     Allergies  Allergen Reactions   Black Walnut Pollen Allergy Skin Test     Other reaction(s): Edema   Epinephrine     Other reaction(s): Irregular Heart Rate   Walnut     Other Reaction(s): edema   Fluticasone     Other reaction(s): Migraine   Promethazine     Other reaction(s): Palpitation    Objective/Physical Exam Neurovascular status intact.  Chronic edema noted around the first MTP.  The first MTP is somewhat elevated and overlap noted to the adjacent digit.  There is pain and tenderness with palpation  Radiographic Exam RT foot 12/12/2022:  Unchanged.  Stable.  The compression staple plate does appear to be somewhat dorsally  displaced compared to prior x-rays with dorsal movement of the great toe joint compared to prior x-rays.  There continues to be good apposition of the arthrodesis site.    Orthopedic screws to the distal heads of the second and third metatarsal unchanged.  There continues to be some slight lateral deviation of the digits.  There is increased soft tissue edema encompassing the foot compared to prior x-rays  Assessment: 1. s/p right first MTP arthrodesis.  Second MTP capsulotomy.  Third toe hammertoe arthroplasty with Weil shortening osteotomy. DOS: 06/02/2022 2.  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  X-rays were reviewed - Patient scheduled for revisional removal of hardware and arthrodesis of the great toe joint the week before Thanksgiving.  In the meantime continue conservative management -Continue Vicodin 10/325 mg every 4 hours PRN pain.  Refill provided -Continue Flexeril 10 mg as needed -Continue gabapentin as prescribed -Patient also has Motrin 800 mg TID.  Continue -Prescription for Medrol Dosepak -Cam boot dispensed.  WBAT as needed -OTC power step arch supports were also dispensed to help support the medial longitudinal arch of the foot and alleviate pressure from the forefoot. - Unfortunately the patient has had multiple surgeries to this area with complication after each procedure.  Again today we had a long discussion regarding the planned procedure.  Postoperative recovery course was also explained.  Risk benefits advantages and disadvantages of the procedure were explained.  No guarantees were expressed or implied.  All patient questions answered.  She is still very positive and understanding despite the postoperative complications she has been experiencing -Authorization for surgery was initiated today.  Surgery will consist of removal of hardware right great toe joint.  Revisional arthrodesis of metatarsophalangeal joint right great toe -Return to  clinic 1 week postop  Felecia Shelling, DPM [facility]  Dr. Felecia Shelling, DPM    [address] [place], [place] [address]                Office [phone]  Fax [phone]

## 2022-12-12 NOTE — Telephone Encounter (Signed)
Sent. Thanks! Sorry about that. -Dr. Logan Bores

## 2022-12-12 NOTE — Telephone Encounter (Signed)
Notified pt medication was sent in 

## 2022-12-12 NOTE — Addendum Note (Signed)
Addended by: Felecia Shelling on: 12/12/2022 01:11 PM   Modules accepted: Orders

## 2022-12-20 ENCOUNTER — Encounter: Payer: Self-pay | Admitting: Podiatry

## 2022-12-20 ENCOUNTER — Other Ambulatory Visit: Payer: Self-pay | Admitting: Podiatry

## 2022-12-20 MED ORDER — HYDROCODONE-ACETAMINOPHEN 10-325 MG PO TABS: 1.0000 | ORAL_TABLET | Freq: Four times a day (QID) | ORAL | 0 refills | Status: DC | PRN

## 2022-12-22 ENCOUNTER — Encounter: Payer: Self-pay | Admitting: Podiatry

## 2022-12-26 ENCOUNTER — Encounter: Payer: Self-pay | Admitting: Podiatry

## 2022-12-26 ENCOUNTER — Other Ambulatory Visit: Payer: Self-pay | Admitting: Podiatry

## 2022-12-26 MED ORDER — HYDROCODONE-ACETAMINOPHEN 10-325 MG PO TABS: 1.0000 | ORAL_TABLET | Freq: Four times a day (QID) | ORAL | 0 refills | Status: AC | PRN

## 2022-12-26 NOTE — Progress Notes (Signed)
PRN chronic postop foot pain

## 2023-01-05 ENCOUNTER — Encounter: Payer: Self-pay | Admitting: Podiatry

## 2023-01-05 ENCOUNTER — Other Ambulatory Visit: Payer: Self-pay | Admitting: Podiatry

## 2023-01-05 MED ORDER — HYDROCODONE-ACETAMINOPHEN 10-325 MG PO TABS: 1.0000 | ORAL_TABLET | Freq: Four times a day (QID) | ORAL | 0 refills | Status: DC | PRN

## 2023-01-05 MED ORDER — HYDROCODONE-ACETAMINOPHEN 10-325 MG PO TABS: 1.0000 | ORAL_TABLET | ORAL | 0 refills | Status: DC | PRN

## 2023-01-11 ENCOUNTER — Other Ambulatory Visit: Payer: Self-pay | Admitting: Podiatry

## 2023-01-13 MED ORDER — HYDROCODONE-ACETAMINOPHEN 10-325 MG PO TABS: 1.0000 | ORAL_TABLET | ORAL | 0 refills | Status: AC | PRN

## 2023-01-15 IMAGING — MR MR FOOT*R* W/O CM
4 of 5 series · 20 of 40 positions shown · non-contrast
Comparison: Right foot radiographs 07/08/2020.

CLINICAL DATA: Chronic foot pain. Plantar fasciitis suspected.
Reported pain behind the 2nd toe for more than 6 months. No acute
injury or prior relevant surgery.

EXAM:
MRI OF THE RIGHT FOREFOOT WITHOUT CONTRAST
TECHNIQUE: Multiplanar, multisequence MR imaging of the right forefoot was
performed. No intravenous contrast was administered.

[Series 4: T1 · coronal · 3.0mm · 0.19mm/px · 3 of 46 slices shown (1 of 2)]
[im 5/46]
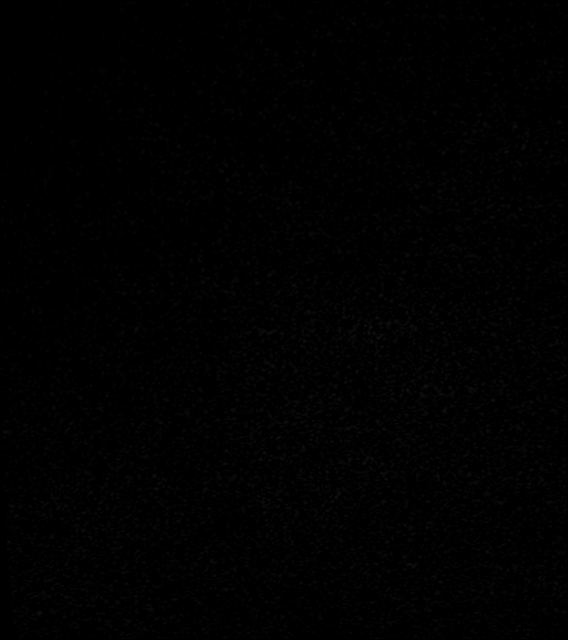
[im 23/46]
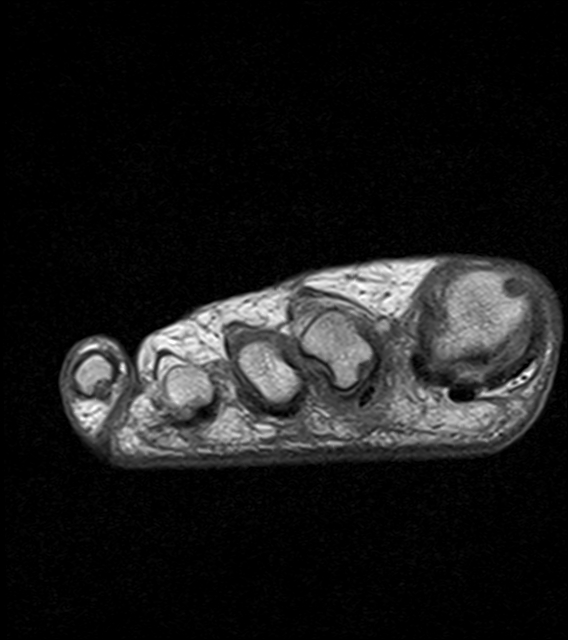
[im 41/46]
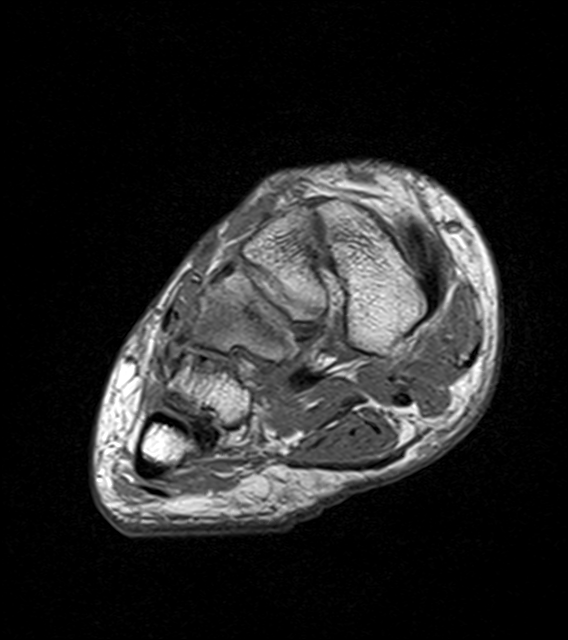

[Series 5: T2 fat-sat · coronal · 3.0mm · 0.23mm/px · 11 of 46 slices shown (1 of 2)]
[im 1/46]
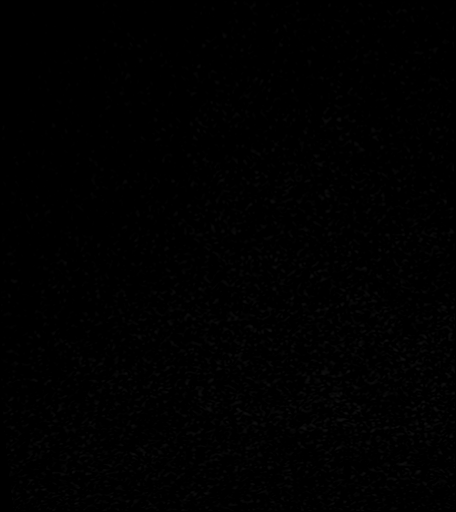
[im 5/46]
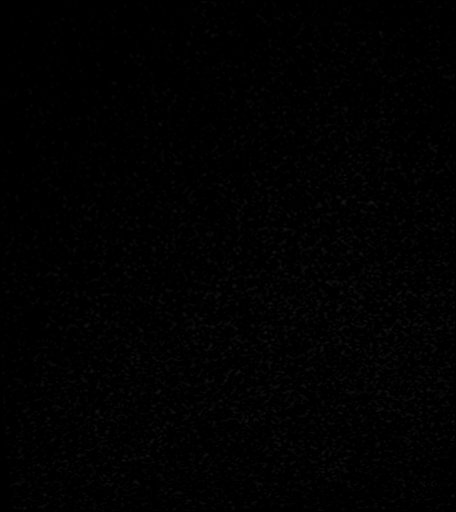
[im 10/46]
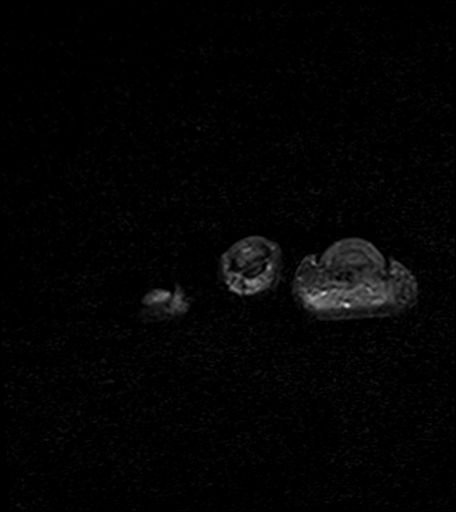
[im 14/46]
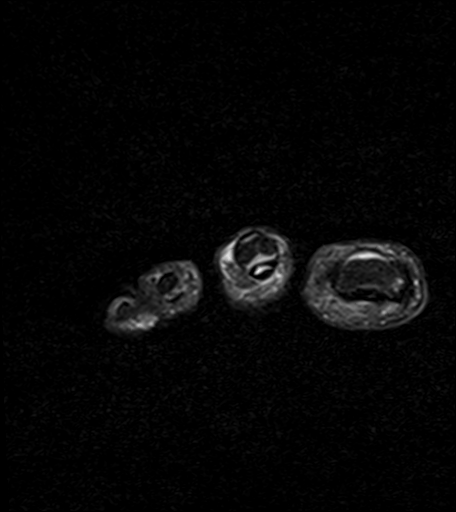
[im 19/46]
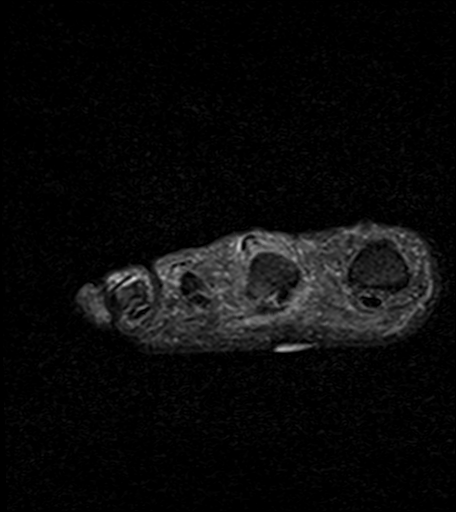
[im 23/46]
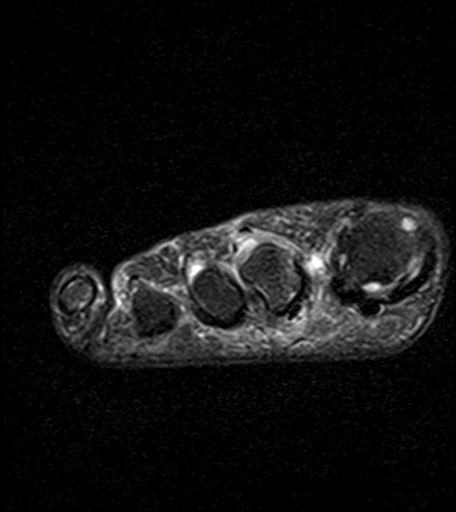
[im 28/46]
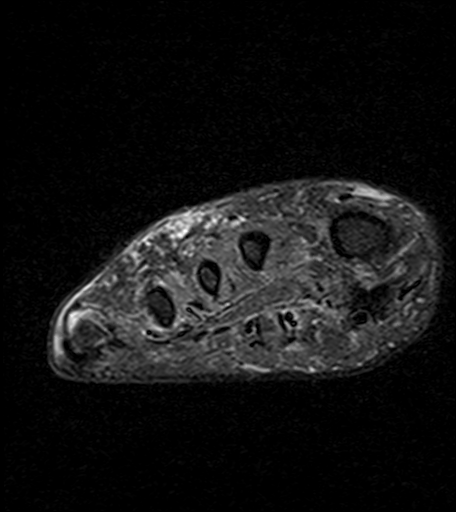
[im 32/46]
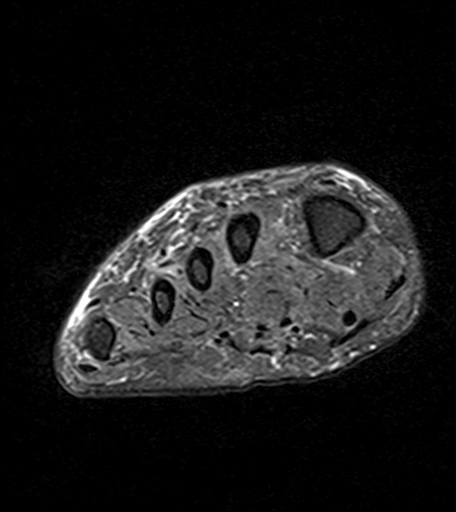
[im 37/46]
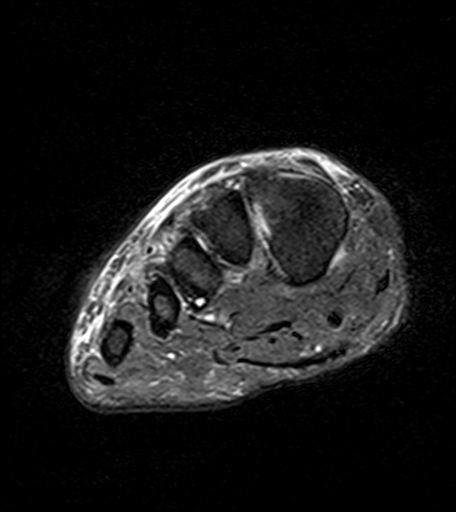
[im 41/46]
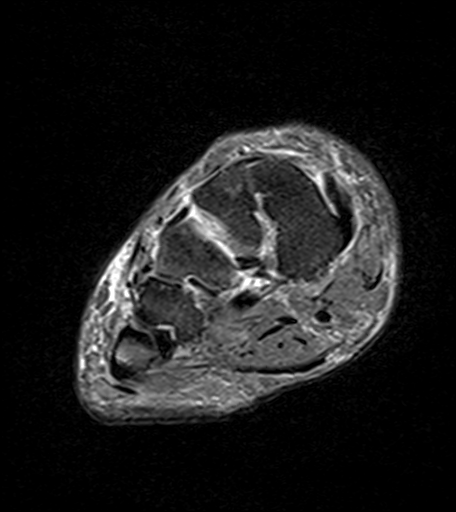
[im 46/46]
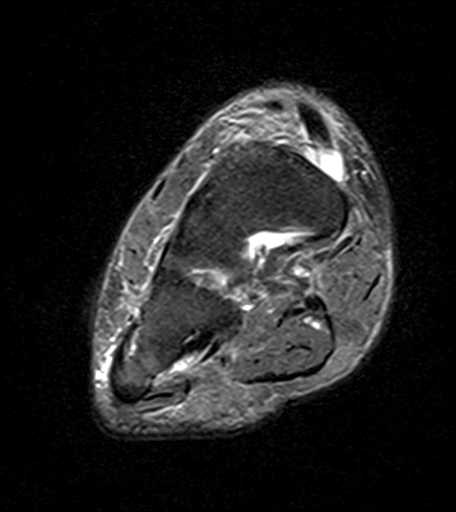

[Series 6: T2 fat-sat · axial · 3.0mm · 0.35mm/px · z∈[-23,+46]mm · 3 of 19 slices shown (2 of 2)]
[im 1/19]
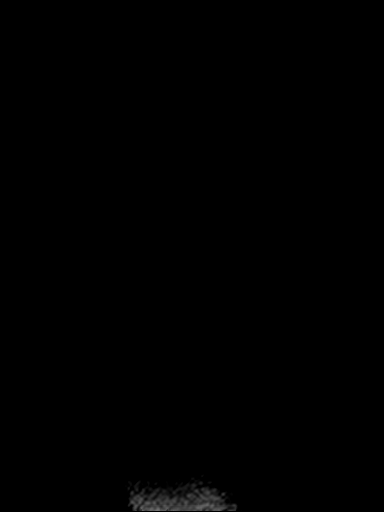
[im 10/19]
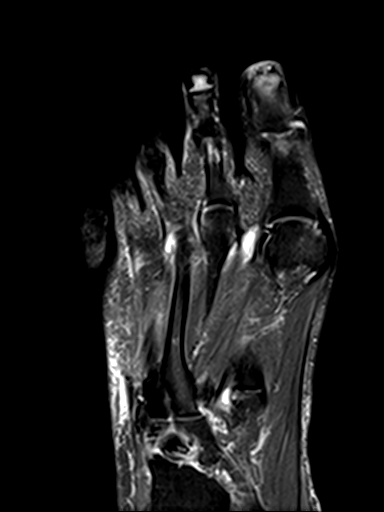
[im 19/19]
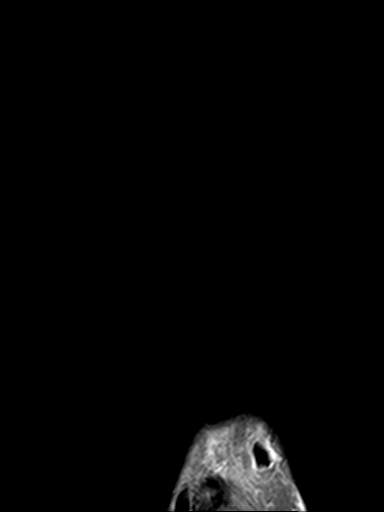

[Series 7: T1 · axial · 3.0mm · 0.35mm/px · z∈[-23,+46]mm · 3 of 19 slices shown (2 of 2)]
[im 1/19]
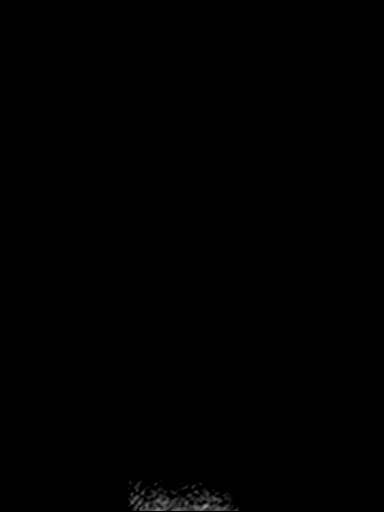
[im 10/19]
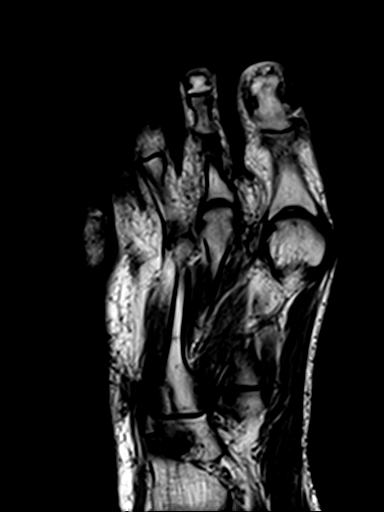
[im 19/19]
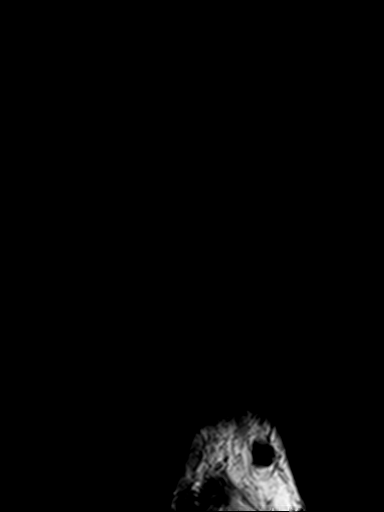

[20 of 40 positions shown; findings below may reference images not displayed]

FINDINGS: Bones/Joint/Cartilage

There is no evidence of acute fracture or dislocation. There are
degenerative changes at the 1st metatarsophalangeal joint associated
with a mild hallux valgus deformity. The tibial sesamoid of the 1st
metatarsal is bipartite. There are mild degenerative changes at the
Lisfranc joint with scattered small subchondral cysts.

Ligaments

The Lisfranc ligament is intact. The collateral ligaments of the
metatarsophalangeal joints appear intact.

Muscles and Tendons

The forefoot tendons and muscles appear intact. The visualized
plantar fascia appears intact. The calcaneal attachment of the
plantar fascia is not included on this examination of the forefoot.

Soft tissues

A marker was placed over the area of pain, plantar to the 2nd MTP
joint. There is mild soft tissue stranding within the subcutaneous
fat plantar to the 2nd MTP joint. No typical Morton's neuroma. There
is a small joint effusion and a small amount of intermetatarsal
fluid in the 1st web space. No other focal fluid collections are
identified. No evidence of foreign body or soft tissue emphysema.
Probable small incidental pressure lesion plantar to the 5th MTP
head.
IMPRESSION: 1. Possible inflammatory changes plantar to the 2nd
metatarsophalangeal joint with a small joint effusion and mild
intermetatarsal bursitis in the 1st web space.
2. The forefoot muscles and tendons appear unremarkable. The
visualized distal aspect of the plantar fascia appears normal.
3. Mild midfoot and 1st MTP degenerative changes. No acute osseous
findings.

## 2023-01-19 ENCOUNTER — Encounter: Payer: Self-pay | Admitting: Podiatry

## 2023-01-19 ENCOUNTER — Other Ambulatory Visit: Payer: Self-pay | Admitting: Podiatry

## 2023-01-30 ENCOUNTER — Other Ambulatory Visit: Payer: Self-pay | Admitting: Podiatry

## 2023-01-30 ENCOUNTER — Encounter: Payer: Self-pay | Admitting: Podiatry

## 2023-01-30 ENCOUNTER — Encounter: Payer: No Typology Code available for payment source | Admitting: Podiatry

## 2023-01-30 MED ORDER — HYDROCODONE-ACETAMINOPHEN 10-325 MG PO TABS: 1.0000 | ORAL_TABLET | Freq: Three times a day (TID) | ORAL | 0 refills | Status: DC | PRN

## 2023-01-30 NOTE — Progress Notes (Signed)
PRN chronic foot pain

## 2023-02-08 ENCOUNTER — Encounter: Payer: No Typology Code available for payment source | Admitting: Podiatry

## 2023-02-08 ENCOUNTER — Other Ambulatory Visit: Payer: Self-pay | Admitting: Podiatry

## 2023-02-09 MED ORDER — HYDROCODONE-ACETAMINOPHEN 10-325 MG PO TABS: 1.0000 | ORAL_TABLET | Freq: Three times a day (TID) | ORAL | 0 refills | Status: DC | PRN

## 2023-02-16 ENCOUNTER — Other Ambulatory Visit: Payer: Self-pay | Admitting: Podiatry

## 2023-02-20 MED ORDER — HYDROCODONE-ACETAMINOPHEN 10-325 MG PO TABS: 1.0000 | ORAL_TABLET | Freq: Three times a day (TID) | ORAL | 0 refills | Status: DC | PRN

## 2023-02-22 ENCOUNTER — Encounter: Payer: No Typology Code available for payment source | Admitting: Podiatry

## 2023-02-23 ENCOUNTER — Other Ambulatory Visit: Payer: Self-pay | Admitting: Podiatry

## 2023-02-27 MED ORDER — HYDROCODONE-ACETAMINOPHEN 10-325 MG PO TABS: 1.0000 | ORAL_TABLET | Freq: Three times a day (TID) | ORAL | 0 refills | Status: AC | PRN

## 2023-03-12 ENCOUNTER — Encounter: Payer: Self-pay | Admitting: Podiatry

## 2023-03-15 ENCOUNTER — Other Ambulatory Visit: Payer: Self-pay | Admitting: Podiatry

## 2023-03-15 MED ORDER — HYDROCODONE-ACETAMINOPHEN 10-325 MG PO TABS: 1.0000 | ORAL_TABLET | Freq: Three times a day (TID) | ORAL | 0 refills | Status: DC | PRN

## 2023-03-15 NOTE — Progress Notes (Signed)
PRN pain 

## 2023-03-20 ENCOUNTER — Ambulatory Visit (INDEPENDENT_AMBULATORY_CARE_PROVIDER_SITE_OTHER): Payer: Non-veteran care | Admitting: Podiatry

## 2023-03-20 ENCOUNTER — Ambulatory Visit (INDEPENDENT_AMBULATORY_CARE_PROVIDER_SITE_OTHER): Payer: No Typology Code available for payment source

## 2023-03-20 ENCOUNTER — Encounter: Payer: Self-pay | Admitting: Podiatry

## 2023-03-20 DIAGNOSIS — F410 Panic disorder [episodic paroxysmal anxiety] without agoraphobia: Secondary | ICD-10-CM | POA: Insufficient documentation

## 2023-03-20 DIAGNOSIS — R6889 Other general symptoms and signs: Secondary | ICD-10-CM | POA: Insufficient documentation

## 2023-03-20 DIAGNOSIS — M461 Sacroiliitis, not elsewhere classified: Secondary | ICD-10-CM | POA: Insufficient documentation

## 2023-03-20 DIAGNOSIS — M179 Osteoarthritis of knee, unspecified: Secondary | ICD-10-CM | POA: Insufficient documentation

## 2023-03-20 DIAGNOSIS — J309 Allergic rhinitis, unspecified: Secondary | ICD-10-CM | POA: Insufficient documentation

## 2023-03-20 DIAGNOSIS — H9071 Mixed conductive and sensorineural hearing loss, unilateral, right ear, with unrestricted hearing on the contralateral side: Secondary | ICD-10-CM | POA: Insufficient documentation

## 2023-03-20 DIAGNOSIS — G43009 Migraine without aura, not intractable, without status migrainosus: Secondary | ICD-10-CM | POA: Insufficient documentation

## 2023-03-20 DIAGNOSIS — M960 Pseudarthrosis after fusion or arthrodesis: Secondary | ICD-10-CM

## 2023-03-20 DIAGNOSIS — E663 Overweight: Secondary | ICD-10-CM | POA: Insufficient documentation

## 2023-03-20 DIAGNOSIS — M545 Low back pain, unspecified: Secondary | ICD-10-CM | POA: Insufficient documentation

## 2023-03-20 DIAGNOSIS — E669 Obesity, unspecified: Secondary | ICD-10-CM | POA: Insufficient documentation

## 2023-03-20 DIAGNOSIS — F330 Major depressive disorder, recurrent, mild: Secondary | ICD-10-CM | POA: Insufficient documentation

## 2023-03-20 DIAGNOSIS — Z0001 Encounter for general adult medical examination with abnormal findings: Secondary | ICD-10-CM | POA: Insufficient documentation

## 2023-03-20 DIAGNOSIS — H526 Other disorders of refraction: Secondary | ICD-10-CM | POA: Insufficient documentation

## 2023-03-20 DIAGNOSIS — M4690 Unspecified inflammatory spondylopathy, site unspecified: Secondary | ICD-10-CM | POA: Insufficient documentation

## 2023-03-20 DIAGNOSIS — G43909 Migraine, unspecified, not intractable, without status migrainosus: Secondary | ICD-10-CM | POA: Insufficient documentation

## 2023-03-20 DIAGNOSIS — H7290 Unspecified perforation of tympanic membrane, unspecified ear: Secondary | ICD-10-CM | POA: Insufficient documentation

## 2023-03-20 DIAGNOSIS — M159 Polyosteoarthritis, unspecified: Secondary | ICD-10-CM | POA: Insufficient documentation

## 2023-03-20 DIAGNOSIS — G4730 Sleep apnea, unspecified: Secondary | ICD-10-CM | POA: Insufficient documentation

## 2023-03-20 DIAGNOSIS — M779 Enthesopathy, unspecified: Secondary | ICD-10-CM | POA: Insufficient documentation

## 2023-03-20 NOTE — Progress Notes (Signed)
 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  DOS: 06/02/2022.  Patient continues to have pain and tenderness on a daily basis with weightbearing.  Unfortunately the arthrodesis site to the great toe has shifted postoperatively and in poor alignment causing excessive pain and tenderness especially to the forefoot.  She compensates which is creating pain down to the lateral aspect of the leg and ankle and foot.  Pain is controlled with Vicodin 10/325 mg every 4 hours.    Patient had some changes of work and was unable to proceed with surgery at the end of the year.  She says that now she is in a position that she would like to pursue the revisional surgery to the great toe.  She has also been experiencing some stiffness to the second third toe as well.  Past Medical History:  Diagnosis Date   Anxiety    on meds   Arthritis    generalized   Depression    on meds   GERD (gastroesophageal reflux disease)    on meds   Melanoma (HCC)    Seasonal allergies    Substance abuse (HCC)    currently taken preventative medications   Thyroid disease    on meds     Allergies  Allergen Reactions   Black Walnut Pollen Allergy Skin Test     Other reaction(s): Edema   Epinephrine     Other reaction(s): Irregular Heart Rate   Walnut     Other Reaction(s): edema   Fluticasone     Other reaction(s): Migraine   Promethazine     Other reaction(s): Palpitation    Objective/Physical Exam Neurovascular status intact.  Chronic edema noted around the first MTP.  The first  MTP is somewhat elevated and overlap noted to the adjacent digit.  Limited range of motion with some slight tenderness also noted to the second third MTP of the right foot  Radiographic Exam RT foot 03/20/2023:  Mostly unchanged.  Stable.  The compression staple plate does appear to be somewhat dorsally displaced compared to prior x-rays with dorsal movement of the great toe joint compared to prior x-rays.  There continues to be good apposition of the arthrodesis site.    Orthopedic screws to the distal heads of the second and third metatarsal unchanged.  There continues to be some slight lateral deviation of the digits.  There is increased soft tissue edema encompassing the foot compared to prior x-rays  Assessment: 1. s/p right first MTP arthrodesis.  Second MTP capsulotomy.  Third toe hammertoe arthroplasty with Weil shortening osteotomy. DOS: 06/02/2022 2.  Recurrent hallux valgus with malalignment first MTP  -Patient evaluated.  X-rays were reviewed -Continue Vicodin 10/325 mg every 4 hours PRN pain.  Refill provided -Continue Flexeril  10 mg as needed.  Patient will need a refill of the Flexeril  10 mg morning of surgery -Continue gabapentin as prescribed -Patient also has Motrin  800 mg TID.  Continue -Again today we had a long discussion regarding the planned procedure.  Postoperative recovery course  was also explained.  Risk benefits advantages and disadvantages of the procedure were explained.  No guarantees were expressed or implied.  All patient questions answered.  She is still very positive and understanding despite the postoperative complications she has been experiencing and poor outcomes of the prior surgery -Authorization for surgery was initiated today.  Surgery will consist of removal of hardware right great toe joint.  Revisional arthrodesis of metatarsophalangeal joint right great toe.  Cortisone injection with manipulation of the joint under anesthesia to the second and third MTP  to help break up any scar tissue and increased range of motion of the toe -Return to clinic 1 week postop  Thresa EMERSON Sar, DPM [facility]  Dr. Thresa EMERSON Sar, DPM    [address] [place], [place] [address]                Office [phone]  Fax [phone]

## 2023-03-21 ENCOUNTER — Telehealth: Payer: Self-pay | Admitting: Urology

## 2023-03-21 NOTE — Telephone Encounter (Signed)
 Pt called stating that she is scheduled for a spinal tap on 04/05/23 wants to know if she will still be able to have sx on 04/06/23 or does she need to reschedule her spinal tap? Please advise.

## 2023-03-23 ENCOUNTER — Other Ambulatory Visit: Payer: Self-pay | Admitting: Podiatry

## 2023-03-23 ENCOUNTER — Encounter: Payer: Self-pay | Admitting: Podiatry

## 2023-03-24 ENCOUNTER — Other Ambulatory Visit: Payer: Self-pay | Admitting: Podiatry

## 2023-03-29 ENCOUNTER — Encounter: Payer: Self-pay | Admitting: Podiatry

## 2023-03-30 ENCOUNTER — Other Ambulatory Visit: Payer: Self-pay | Admitting: Podiatry

## 2023-03-30 MED ORDER — HYDROCODONE-ACETAMINOPHEN 10-325 MG PO TABS: 1.0000 | ORAL_TABLET | ORAL | 0 refills | Status: DC | PRN

## 2023-03-30 NOTE — Progress Notes (Signed)
PRN pain 

## 2023-04-03 ENCOUNTER — Other Ambulatory Visit: Payer: Self-pay | Admitting: Podiatry

## 2023-04-03 MED ORDER — HYDROCODONE-ACETAMINOPHEN 10-325 MG PO TABS: 1.0000 | ORAL_TABLET | ORAL | 0 refills | Status: DC | PRN

## 2023-04-06 ENCOUNTER — Other Ambulatory Visit: Payer: Self-pay | Admitting: Podiatry

## 2023-04-06 DIAGNOSIS — M24874 Other specific joint derangements of right foot, not elsewhere classified: Secondary | ICD-10-CM | POA: Diagnosis not present

## 2023-04-06 DIAGNOSIS — Z85820 Personal history of malignant melanoma of skin: Secondary | ICD-10-CM | POA: Insufficient documentation

## 2023-04-06 DIAGNOSIS — M960 Pseudarthrosis after fusion or arthrodesis: Secondary | ICD-10-CM | POA: Diagnosis not present

## 2023-04-06 DIAGNOSIS — Z4889 Encounter for other specified surgical aftercare: Secondary | ICD-10-CM | POA: Diagnosis not present

## 2023-04-06 MED ORDER — CYCLOBENZAPRINE HCL 10 MG PO TABS: 10.0000 mg | ORAL_TABLET | Freq: Three times a day (TID) | ORAL | 0 refills | Status: DC | PRN

## 2023-04-06 MED ORDER — IBUPROFEN 800 MG PO TABS: 800.0000 mg | ORAL_TABLET | Freq: Three times a day (TID) | ORAL | 1 refills | Status: AC

## 2023-04-06 MED ORDER — OXYCODONE-ACETAMINOPHEN 5-325 MG PO TABS: 1.0000 | ORAL_TABLET | ORAL | 0 refills | Status: DC | PRN

## 2023-04-06 NOTE — Progress Notes (Signed)
PRN postop

## 2023-04-12 ENCOUNTER — Encounter: Payer: Self-pay | Admitting: Podiatry

## 2023-04-12 ENCOUNTER — Ambulatory Visit (INDEPENDENT_AMBULATORY_CARE_PROVIDER_SITE_OTHER): Payer: No Typology Code available for payment source

## 2023-04-12 ENCOUNTER — Ambulatory Visit (INDEPENDENT_AMBULATORY_CARE_PROVIDER_SITE_OTHER): Payer: No Typology Code available for payment source | Admitting: Podiatry

## 2023-04-12 VITALS — Ht 67.0 in | Wt 174.0 lb

## 2023-04-12 DIAGNOSIS — Z9889 Other specified postprocedural states: Secondary | ICD-10-CM

## 2023-04-12 MED ORDER — CYCLOBENZAPRINE HCL 10 MG PO TABS: 10.0000 mg | ORAL_TABLET | Freq: Three times a day (TID) | ORAL | 0 refills | Status: DC | PRN

## 2023-04-12 MED ORDER — HYDROCODONE-ACETAMINOPHEN 10-325 MG PO TABS: 1.0000 | ORAL_TABLET | ORAL | 0 refills | Status: AC | PRN

## 2023-04-12 NOTE — Progress Notes (Signed)
   Chief Complaint  Patient presents with   Routine Post Op    Pt is here for routine post op after surgery to right foot.    Subjective:  Patient presents today status post revisional arthrodesis to the right great toe.  DOS: 04/06/2023.  Patient doing well.  She has been nonweightbearing to the surgical extremity in the cam boot using the knee scooter.  Past Medical History:  Diagnosis Date   Anxiety    on meds   Arthritis    generalized   Depression    on meds   GERD (gastroesophageal reflux disease)    on meds   Melanoma (HCC)    Seasonal allergies    Substance abuse (HCC)    currently taken preventative medications   Thyroid disease    on meds    Past Surgical History:  Procedure Laterality Date   CESAREAN SECTION  2008   FOOT SURGERY  08/2021   x 3 surgeries total   TONSILLECTOMY  1989   WISDOM TOOTH EXTRACTION  1989    Allergies  Allergen Reactions   Black Walnut Pollen Allergy Skin Test     Other reaction(s): Edema   Epinephrine     Other reaction(s): Irregular Heart Rate   Walnut     Other Reaction(s): edema   Fluticasone     Other reaction(s): Migraine   Promethazine     Other reaction(s): Palpitation    Objective/Physical Exam Neurovascular status intact.  Incision well coapted with sutures intact. No sign of infectious process noted. No dehiscence. No active bleeding noted.  Moderate edema noted to the surgical extremity.  Radiographic Exam RT foot 04/12/2023:  Arthrodesis site appears stable with orthopedic hardware intact.  No malalignment.  Good alignment of the first ray.  Assessment: 1. s/p revisional right great toe arthrodesis. DOS: 04/06/2023   Plan of Care:  -Patient was evaluated. X-rays reviewed - Dressings changed.  Leave clean dry and intact x 1 week -Continue strict NWB CAM boot using the knee scooter -Prescription for Vicodin 10/325 mg every 4 hours as needed pain -Prescription for Flexeril  10 mg as needed -Return to clinic 1  week   Thresa EMERSON Sar, DPM [facility]  Dr. Thresa EMERSON Sar, DPM    [address] [place], [place] [address]                Office [phone]  Fax [phone]

## 2023-04-19 ENCOUNTER — Encounter: Payer: No Typology Code available for payment source | Admitting: Podiatry

## 2023-04-19 ENCOUNTER — Encounter: Payer: Self-pay | Admitting: Podiatry

## 2023-04-24 ENCOUNTER — Encounter: Payer: Self-pay | Admitting: Podiatry

## 2023-04-24 ENCOUNTER — Ambulatory Visit (INDEPENDENT_AMBULATORY_CARE_PROVIDER_SITE_OTHER): Payer: No Typology Code available for payment source | Admitting: Podiatry

## 2023-04-24 DIAGNOSIS — Z9889 Other specified postprocedural states: Secondary | ICD-10-CM

## 2023-04-24 MED ORDER — HYDROCODONE-ACETAMINOPHEN 10-325 MG PO TABS: 1.0000 | ORAL_TABLET | ORAL | 0 refills | Status: DC | PRN

## 2023-04-24 MED ORDER — SILVER SULFADIAZINE 1 % EX CREA: 1.0000 | TOPICAL_CREAM | Freq: Every day | CUTANEOUS | 0 refills | Status: AC

## 2023-04-24 NOTE — Progress Notes (Signed)
   Chief Complaint  Patient presents with   Routine Post Op    Patient states that she has had some pain in the joint of her right hallux , it felt like a stabbing pain . Patient needs a RX of pain medication because she is down to four now     Subjective:  Patient presents today status post revisional arthrodesis to the right great toe.  DOS: 04/06/2023.  Patient continues to do well.  She does have some pain associated to the surgical site but overall she is doing well  Past Medical History:  Diagnosis Date   Anxiety    on meds   Arthritis    generalized   Depression    on meds   GERD (gastroesophageal reflux disease)    on meds   Melanoma (HCC)    Seasonal allergies    Substance abuse (HCC)    currently taken preventative medications   Thyroid disease    on meds    Past Surgical History:  Procedure Laterality Date   CESAREAN SECTION  2008   FOOT SURGERY  08/2021   x 3 surgeries total   TONSILLECTOMY  1989   WISDOM TOOTH EXTRACTION  1989    Allergies  Allergen Reactions   Black Walnut Pollen Allergy Skin Test     Other reaction(s): Edema   Epinephrine     Other reaction(s): Irregular Heart Rate   Walnut     Other Reaction(s): edema   Fluticasone     Other reaction(s): Migraine   Promethazine     Other reaction(s): Palpitation    Objective/Physical Exam Neurovascular status intact.  Incision well coapted with sutures intact. No sign of infectious process noted. No dehiscence. No active bleeding noted.  Moderate edema noted to the surgical extremity.  Radiographic Exam RT foot 04/12/2023:  Arthrodesis site appears stable with orthopedic hardware intact.  No malalignment.  Good alignment of the first ray.  Assessment: 1. s/p revisional right great toe arthrodesis. DOS: 04/06/2023   Plan of Care:  -Patient was evaluated.  Sutures removed - Patient may begin washing and showering and getting the foot wet.  Dressing supplies provided.  Ace wrap. -Prescription  for Silvadene cream to apply along the incision site -Continue strict NWB CAM boot using the knee scooter - Refill prescription for Vicodin 10/325 mg every 4 hours as needed pain -Prescription for Flexeril 10 mg as needed -Return to clinic 2 weeks follow-up x-ray   Felecia Shelling, DPM [facility]  Dr. Felecia Shelling, DPM    [address] [place], [place] [address]                Office [phone]  Fax [phone]

## 2023-04-25 ENCOUNTER — Encounter: Payer: Self-pay | Admitting: Podiatry

## 2023-05-03 ENCOUNTER — Encounter: Payer: No Typology Code available for payment source | Admitting: Podiatry

## 2023-05-03 ENCOUNTER — Encounter: Payer: Self-pay | Admitting: Podiatry

## 2023-05-03 ENCOUNTER — Other Ambulatory Visit: Payer: Self-pay | Admitting: Podiatry

## 2023-05-03 MED ORDER — HYDROCODONE-ACETAMINOPHEN 10-325 MG PO TABS: 1.0000 | ORAL_TABLET | ORAL | 0 refills | Status: DC | PRN

## 2023-05-03 NOTE — Progress Notes (Signed)
 PRN postop

## 2023-05-08 ENCOUNTER — Encounter: Payer: Self-pay | Admitting: Podiatry

## 2023-05-08 ENCOUNTER — Ambulatory Visit (INDEPENDENT_AMBULATORY_CARE_PROVIDER_SITE_OTHER): Payer: No Typology Code available for payment source | Admitting: Podiatry

## 2023-05-08 ENCOUNTER — Ambulatory Visit (INDEPENDENT_AMBULATORY_CARE_PROVIDER_SITE_OTHER)

## 2023-05-08 DIAGNOSIS — M960 Pseudarthrosis after fusion or arthrodesis: Secondary | ICD-10-CM

## 2023-05-08 DIAGNOSIS — Z9889 Other specified postprocedural states: Secondary | ICD-10-CM

## 2023-05-08 NOTE — Progress Notes (Signed)
   Chief Complaint  Patient presents with   Routine Post Op    POV # 3 DOS 04/06/23 --- REMOVAL OF HARDWARE RIGHT GREAT TOE, REVISIONAL ARTHRODESIS RIGHT GREAT TOE JOINT, CORTISONE INJECTION WITH MANIPULATION OF JOINT 2ND AND 3RD TOES RIGHT   "Everything has been going really well, not a lot of pain. The only thing I am concerned about it my toes stay cold all the time"    Subjective:  Patient presents today status post revisional arthrodesis to the right great toe.  DOS: 04/06/2023.  Patient continues to do well.  She does have some pain associated to the surgical site but overall she is doing well  Past Medical History:  Diagnosis Date   Anxiety    on meds   Arthritis    generalized   Depression    on meds   GERD (gastroesophageal reflux disease)    on meds   Melanoma (HCC)    Seasonal allergies    Substance abuse (HCC)    currently taken preventative medications   Thyroid disease    on meds    Past Surgical History:  Procedure Laterality Date   CESAREAN SECTION  2008   FOOT SURGERY  08/2021   x 3 surgeries total   TONSILLECTOMY  1989   WISDOM TOOTH EXTRACTION  1989    Allergies  Allergen Reactions   Black Walnut Pollen Allergy Skin Test     Other reaction(s): Edema   Epinephrine     Other reaction(s): Irregular Heart Rate   Walnut     Other Reaction(s): edema   Fluticasone     Other reaction(s): Migraine   Promethazine     Other reaction(s): Palpitation    Objective/Physical Exam Neurovascular status intact.  Incision well coapted with sutures intact. No sign of infectious process noted. No dehiscence. No active bleeding noted.  Moderate edema noted to the surgical extremity.  Radiographic Exam RT foot 04/12/2023:  Arthrodesis site appears stable with orthopedic hardware intact.  No malalignment.  Good alignment of the first ray.  Assessment: 1. s/p revisional right great toe arthrodesis. DOS: 04/06/2023   Plan of Care:  -Patient was evaluated.  - Continue  NWB in the cam boot for an additional 2 weeks -Continue Vicodin 10/325 mg PRN pain -In 2 weeks the patient may begin WBAT in the cam boot.  Slow transition off of the knee scooter -Return to clinic in 4 weeks.  At that time we will consider transitioning the patient slowly out of the cam boot into a surgical shoe and allow her to begin driving   Felecia Shelling, DPM [facility]  Dr. Felecia Shelling, DPM    [address] [place], [place] [address]                Office [phone]  Fax [phone]

## 2023-05-17 ENCOUNTER — Encounter: Payer: Self-pay | Admitting: Podiatry

## 2023-05-17 ENCOUNTER — Other Ambulatory Visit: Payer: Self-pay | Admitting: Podiatry

## 2023-05-18 ENCOUNTER — Other Ambulatory Visit: Payer: Self-pay | Admitting: Podiatry

## 2023-05-18 MED ORDER — CYCLOBENZAPRINE HCL 10 MG PO TABS: 10.0000 mg | ORAL_TABLET | Freq: Three times a day (TID) | ORAL | 0 refills | Status: AC | PRN

## 2023-05-18 MED ORDER — HYDROCODONE-ACETAMINOPHEN 10-325 MG PO TABS: 1.0000 | ORAL_TABLET | Freq: Four times a day (QID) | ORAL | 0 refills | Status: AC | PRN

## 2023-06-05 ENCOUNTER — Encounter: Payer: Self-pay | Admitting: Podiatry

## 2023-06-05 ENCOUNTER — Ambulatory Visit (INDEPENDENT_AMBULATORY_CARE_PROVIDER_SITE_OTHER)

## 2023-06-05 ENCOUNTER — Ambulatory Visit (INDEPENDENT_AMBULATORY_CARE_PROVIDER_SITE_OTHER): Admitting: Podiatry

## 2023-06-05 DIAGNOSIS — M960 Pseudarthrosis after fusion or arthrodesis: Secondary | ICD-10-CM

## 2023-06-05 DIAGNOSIS — Z9889 Other specified postprocedural states: Secondary | ICD-10-CM

## 2023-06-05 MED ORDER — HYDROCODONE-ACETAMINOPHEN 10-325 MG PO TABS: 1.0000 | ORAL_TABLET | Freq: Three times a day (TID) | ORAL | 0 refills | Status: AC | PRN

## 2023-06-05 NOTE — Progress Notes (Signed)
 Chief Complaint  Patient presents with   Routine Post Op    DOS 04/06/23 --- REMOVAL OF HARDWARE RIGHT GREAT TOE, REVISIONAL ARTHRODESIS RIGHT GREAT TOE JOINT, CORTISONE INJECTION WITH MANIPULATION OF JOINT 2ND AND 3RD TOES RIGHT    "Its the best its looked in a long time and its starting to feel a little better"    Subjective:  Patient presents today status post revisional arthrodesis to the right great toe.  DOS: 04/06/2023.  Patient continues to do well.  She continues to have some pain with ambulation but overall she is doing much better.  Past Medical History:  Diagnosis Date   Anxiety    on meds   Arthritis    generalized   Depression    on meds   GERD (gastroesophageal reflux disease)    on meds   Melanoma (HCC)    Seasonal allergies    Substance abuse (HCC)    currently taken preventative medications   Thyroid disease    on meds    Past Surgical History:  Procedure Laterality Date   CESAREAN SECTION  2008   FOOT SURGERY  08/2021   x 3 surgeries total   TONSILLECTOMY  1989   WISDOM TOOTH EXTRACTION  1989    Allergies  Allergen Reactions   Black Walnut Pollen Allergy Skin Test     Other reaction(s): Edema   Epinephrine     Other reaction(s): Irregular Heart Rate   Walnut     Other Reaction(s): edema   Fluticasone     Other reaction(s): Migraine   Promethazine     Other reaction(s): Palpitation    Objective/Physical Exam Neurovascular status intact.  Incision nicely healed.  There continues to be some chronic moderate edema localized around the first MTP of the right foot.  With weightbearing she does seem to compensate and excessively low the lateral column.  Somewhat expected since the patient has had a long history of complications to the surgical site.  Radiographic Exam RT foot 06/05/2023:  Mostly unchanged.  Arthrodesis site appears stable with orthopedic hardware intact.  No malalignment.  Good alignment of the first ray.  Assessment: 1. s/p  revisional right great toe arthrodesis. DOS: 04/06/2023   Plan of Care:  -Patient was evaluated.  -Continue the external bone stimulator daily - Postop shoe dispensed.  WBAT.  Discontinue cam boot.  Continue the knee scooter for long distances -Continue Vicodin 10/325 mg Q8H PRN pain.  Today we had a discussion of slowly tapering the patient off of the Vicodin.  Unfortunately she has been on Vicodin chronically due to the revisional surgeries and complications that she is sustained to the right foot.  She understands and is okay to begin slowly tapering.  For now however, we are transitioning the patient out of the cam boot into a postoperative shoe.  This may cause some increased discomfort and so we will continue the every 8 hours regimen for now. - After 1 month of wearing the postop shoe she may begin to slowly transition into good supportive tennis shoes and sneakers -Return to clinic 6 weeks follow-up x-ray   Felecia Shelling, DPM [facility]  Dr. Felecia Shelling, DPM    [address] [place], [place] [address]  Office [phone]  Fax [phone]

## 2023-06-16 ENCOUNTER — Encounter: Payer: Self-pay | Admitting: Podiatry

## 2023-06-17 ENCOUNTER — Other Ambulatory Visit: Payer: Self-pay | Admitting: Podiatry

## 2023-06-17 MED ORDER — HYDROCODONE-ACETAMINOPHEN 10-325 MG PO TABS: 1.0000 | ORAL_TABLET | Freq: Three times a day (TID) | ORAL | 0 refills | Status: AC | PRN

## 2023-07-05 ENCOUNTER — Other Ambulatory Visit: Payer: Self-pay | Admitting: Podiatry

## 2023-07-05 MED ORDER — HYDROCODONE-ACETAMINOPHEN 10-325 MG PO TABS: 1.0000 | ORAL_TABLET | Freq: Three times a day (TID) | ORAL | 0 refills | Status: AC | PRN

## 2023-07-05 NOTE — Progress Notes (Signed)
 PRN postop

## 2023-07-17 ENCOUNTER — Ambulatory Visit (INDEPENDENT_AMBULATORY_CARE_PROVIDER_SITE_OTHER): Admitting: Podiatry

## 2023-07-17 ENCOUNTER — Ambulatory Visit (INDEPENDENT_AMBULATORY_CARE_PROVIDER_SITE_OTHER)

## 2023-07-17 ENCOUNTER — Encounter: Payer: Self-pay | Admitting: Podiatry

## 2023-07-17 VITALS — Wt 174.0 lb

## 2023-07-17 DIAGNOSIS — Z9889 Other specified postprocedural states: Secondary | ICD-10-CM | POA: Diagnosis not present

## 2023-07-17 DIAGNOSIS — M79671 Pain in right foot: Secondary | ICD-10-CM | POA: Diagnosis not present

## 2023-07-17 DIAGNOSIS — G5791 Unspecified mononeuropathy of right lower limb: Secondary | ICD-10-CM | POA: Diagnosis not present

## 2023-07-17 NOTE — Progress Notes (Signed)
   Chief Complaint  Patient presents with   Routine Post Op    F/U for DOS 04/06/23 --- REMOVAL OF HARDWARE RIGHT GREAT TOE, REVISIONAL ARTHRODESIS RIGHT GREAT TOE JOINT, CORTISONE INJECTION WITH MANIPULATION OF JOINT 2ND AND 3RD TOES RIGHT( new xrays)7 pain. Sharp tabbing pain. Non diabetic.    Subjective:  Patient presents today status post revisional arthrodesis to the right great toe.  DOS: 04/06/2023.  Patient continues to do well.  She continues to have some pain with ambulation but overall she is doing much better.  Past Medical History:  Diagnosis Date   Anxiety    on meds   Arthritis    generalized   Depression    on meds   GERD (gastroesophageal reflux disease)    on meds   Melanoma (HCC)    Seasonal allergies    Substance abuse (HCC)    currently taken preventative medications   Thyroid disease    on meds    Past Surgical History:  Procedure Laterality Date   CESAREAN SECTION  2008   FOOT SURGERY  08/2021   x 3 surgeries total   TONSILLECTOMY  1989   WISDOM TOOTH EXTRACTION  1989    Allergies  Allergen Reactions   Black Walnut Pollen Allergy Skin Test     Other reaction(s): Edema   Epinephrine     Other reaction(s): Irregular Heart Rate   Walnut     Other Reaction(s): edema   Fluticasone     Other reaction(s): Migraine   Promethazine     Other reaction(s): Palpitation    Objective/Physical Exam Neurovascular status intact.  Incision nicely healed.  Minimal edema noted.  No range of motion or crepitus in first MTP.  Appears stable.  Patient does get some sharp stabbing sensations especially in the evenings at the end of the day  Radiographic Exam RT foot 06/05/2023:  Mostly unchanged.  Arthrodesis site appears stable with orthopedic hardware intact.  No malalignment.  Good alignment of the first ray.  Assessment: 1. s/p revisional right great toe arthrodesis. DOS: 04/06/2023 2.  Neuritis right foot surgical area  Plan of Care:  -Patient was  evaluated.  -Continue the external bone stimulator daily - Currently weightbearing in tennis shoes with prefabricated arch supports.  Continue -Continue Vicodin 10/325 mg Q8H PRN pain.  Today we had a discussion of slowly tapering the patient off of the Vicodin.  Plan to eventually extend the frequency to Q12H at the same dosage. -Order placed for peripheral neuropathy pain cream which was faxed to [facility] in [place] -Return to clinic 6 weeks follow-up x-ray   Dot Gazella, DPM [facility]  Dr. Dot Gazella, DPM    [address] [place], [place] [address]                Office [phone]  Fax [phone]

## 2023-08-02 ENCOUNTER — Encounter: Payer: Self-pay | Admitting: Podiatry

## 2023-08-05 ENCOUNTER — Encounter: Payer: Self-pay | Admitting: Podiatry

## 2023-08-05 ENCOUNTER — Other Ambulatory Visit: Payer: Self-pay | Admitting: Podiatry

## 2023-08-05 MED ORDER — HYDROCODONE-ACETAMINOPHEN 10-325 MG PO TABS: 1.0000 | ORAL_TABLET | Freq: Three times a day (TID) | ORAL | 0 refills | Status: DC | PRN

## 2023-08-05 NOTE — Progress Notes (Signed)
 PRN postop pain

## 2023-08-15 ENCOUNTER — Encounter: Payer: Self-pay | Admitting: Podiatry

## 2023-08-15 ENCOUNTER — Other Ambulatory Visit: Payer: Self-pay | Admitting: Podiatry

## 2023-08-15 MED ORDER — HYDROCODONE-ACETAMINOPHEN 10-325 MG PO TABS: 1.0000 | ORAL_TABLET | Freq: Three times a day (TID) | ORAL | 0 refills | Status: AC | PRN

## 2023-08-15 MED ORDER — HYDROCODONE-ACETAMINOPHEN 10-325 MG PO TABS: 1.0000 | ORAL_TABLET | Freq: Three times a day (TID) | ORAL | 0 refills | Status: DC | PRN

## 2023-08-15 NOTE — Progress Notes (Signed)
 PRN postop pain

## 2023-08-24 ENCOUNTER — Encounter: Payer: Self-pay | Admitting: Podiatry

## 2023-08-28 ENCOUNTER — Other Ambulatory Visit: Payer: Self-pay | Admitting: Podiatry

## 2023-08-28 ENCOUNTER — Encounter: Payer: Self-pay | Admitting: Podiatry

## 2023-08-28 ENCOUNTER — Ambulatory Visit (INDEPENDENT_AMBULATORY_CARE_PROVIDER_SITE_OTHER): Admitting: Podiatry

## 2023-08-28 ENCOUNTER — Ambulatory Visit (INDEPENDENT_AMBULATORY_CARE_PROVIDER_SITE_OTHER)

## 2023-08-28 VITALS — Ht 67.0 in | Wt 174.0 lb

## 2023-08-28 DIAGNOSIS — Z9889 Other specified postprocedural states: Secondary | ICD-10-CM

## 2023-08-28 DIAGNOSIS — M960 Pseudarthrosis after fusion or arthrodesis: Secondary | ICD-10-CM

## 2023-08-28 MED ORDER — HYDROCODONE-ACETAMINOPHEN 10-325 MG PO TABS: 1.0000 | ORAL_TABLET | Freq: Three times a day (TID) | ORAL | 0 refills | Status: DC | PRN

## 2023-08-28 NOTE — Progress Notes (Signed)
PRN chronic foot pain

## 2023-08-28 NOTE — Progress Notes (Signed)
   No chief complaint on file.   Subjective:  Patient presents today status post revisional arthrodesis to the right great toe.  DOS: 04/06/2023.  Since last visit the patient has noticed increased swelling which is mostly chronic however she has also had some bruising/ecchymosis to the surgical foot.  Past Medical History:  Diagnosis Date   Anxiety    on meds   Arthritis    generalized   Depression    on meds   GERD (gastroesophageal reflux disease)    on meds   Melanoma (HCC)    Seasonal allergies    Substance abuse (HCC)    currently taken preventative medications   Thyroid disease    on meds    Past Surgical History:  Procedure Laterality Date   CESAREAN SECTION  2008   FOOT SURGERY  08/2021   x 3 surgeries total   TONSILLECTOMY  1989   WISDOM TOOTH EXTRACTION  1989    Allergies  Allergen Reactions   Black Walnut Pollen Allergy Skin Test     Other reaction(s): Edema   Epinephrine     Other reaction(s): Irregular Heart Rate   Walnut     Other Reaction(s): edema   Fluticasone     Other reaction(s): Migraine   Promethazine     Other reaction(s): Palpitation    Objective/Physical Exam Neurovascular status intact.  Chronic edema noted around the surgical site of the right foot today with some ecchymosis around the midportion of the metatarsals and dorsum of the foot.  There continues to be some mild chronic tenderness with palpation.  Radiographic Exam RT foot 08/28/2023:  Mostly unchanged.  Arthrodesis site appears stable with orthopedic hardware intact.  There is no radiolucency around the hardware.  No concern for loosening.  No malalignment.  Good alignment of the first ray.  Assessment: 1. s/p revisional right great toe arthrodesis. DOS: 04/06/2023  Plan of Care:  -Patient was evaluated.  -Continue the external bone stimulator daily - Currently weightbearing in tennis shoes with prefabricated arch supports.  Continue -Continue Vicodin 10/325 mg Q8H PRN  pain.  Refill sent today, 08/28/2023 -Patient was unable to fill the peripheral neuropathy pain cream which was faxed to [facility] in [place] -Due to the chronic pain associated with surgical site with acute onset of the bruising I do believe it is appropriate this time to order a CT scan of the right foot.  Order placed for CT scan right foot wo contrast at [facility] imaging  -Will plan to contact the patient after CT scan to review the results and discuss further treatment options   Thresa EMERSON Sar, DPM [facility]  Dr. Thresa EMERSON Sar, DPM    [address] [place], [place] [address]                Office [phone]  Fax [phone]

## 2023-09-01 ENCOUNTER — Ambulatory Visit
Admission: RE | Admit: 2023-09-01 | Discharge: 2023-09-01 | Disposition: A | Discharge status: Home / Self Care | Payer: Self-pay | Source: Ambulatory Visit | Attending: Podiatry | Admitting: Podiatry

## 2023-09-01 DIAGNOSIS — M960 Pseudarthrosis after fusion or arthrodesis: Secondary | ICD-10-CM

## 2023-09-06 ENCOUNTER — Encounter: Payer: Self-pay | Admitting: Podiatry

## 2023-09-06 ENCOUNTER — Other Ambulatory Visit: Payer: Self-pay | Admitting: Podiatry

## 2023-09-06 MED ORDER — HYDROCODONE-ACETAMINOPHEN 10-325 MG PO TABS: 1.0000 | ORAL_TABLET | Freq: Four times a day (QID) | ORAL | 0 refills | Status: DC | PRN

## 2023-09-06 NOTE — Progress Notes (Signed)
 PRN foot pain

## 2023-09-18 ENCOUNTER — Encounter: Payer: Self-pay | Admitting: Podiatry

## 2023-09-21 ENCOUNTER — Other Ambulatory Visit: Payer: Self-pay | Admitting: Podiatry

## 2023-09-21 MED ORDER — HYDROCODONE-ACETAMINOPHEN 10-325 MG PO TABS: 1.0000 | ORAL_TABLET | Freq: Four times a day (QID) | ORAL | 0 refills | Status: DC | PRN

## 2023-09-21 NOTE — Progress Notes (Signed)
 PRN postop pain

## 2023-10-02 ENCOUNTER — Other Ambulatory Visit: Payer: Self-pay | Admitting: Podiatry

## 2023-10-02 ENCOUNTER — Encounter: Payer: Self-pay | Admitting: Podiatry

## 2023-10-02 MED ORDER — HYDROCODONE-ACETAMINOPHEN 10-325 MG PO TABS
1.0000 | ORAL_TABLET | Freq: Four times a day (QID) | ORAL | 0 refills | Status: DC | PRN
Start: 2023-10-02 — End: 2023-10-20

## 2023-10-02 NOTE — Progress Notes (Signed)
PRN chronic postop pain

## 2023-10-03 ENCOUNTER — Ambulatory Visit: Admitting: Podiatry

## 2023-10-19 ENCOUNTER — Encounter: Payer: Self-pay | Admitting: Podiatry

## 2023-10-20 ENCOUNTER — Other Ambulatory Visit: Payer: Self-pay | Admitting: Podiatry

## 2023-10-20 ENCOUNTER — Encounter: Payer: Self-pay | Admitting: Podiatry

## 2023-10-20 MED ORDER — HYDROCODONE-ACETAMINOPHEN 10-325 MG PO TABS: 1.0000 | ORAL_TABLET | Freq: Four times a day (QID) | ORAL | 0 refills | Status: DC | PRN

## 2023-11-07 ENCOUNTER — Other Ambulatory Visit: Payer: Self-pay | Admitting: Podiatry

## 2023-11-07 MED ORDER — HYDROCODONE-ACETAMINOPHEN 10-325 MG PO TABS: 1.0000 | ORAL_TABLET | Freq: Four times a day (QID) | ORAL | 0 refills | Status: DC | PRN

## 2023-11-07 NOTE — Progress Notes (Signed)
   Spoke with patient this AM regarding her condition and the continued pain that she is going to the foot.  She inform me that the bruising has subsided but she continues to have some swelling to the foot with ' popping' sensation to the great toe.  S/P revisional arthrodesis to the right great toe.  DOS: 04/06/2023.  Although CT scan demonstrates stability throughout the arthrodesis site I do believe that she is developing nonunion simply because of the continued chronic swelling and the audible popping sensation with associated pain.  Via telephone today we did discuss what I believe would be the next step to help alleviate her symptoms.  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  Overall the first ray is in decent rectus alignment and stable.  I do believe that removal of the hardware with arthroplasty and double stem Silastic implant is a viable option.  With implantation there is no concern for fusion and nonunion postoperatively.  Patients in the past have done well with arthroplasty with implant after nonunion of the great toe joint.  This was discussed with the patient via telephone.  In the meantime I do believe she should continue the external bone stimulator.  She wears the immobilization cam boot intermittently.  As long as she continues to be under my postoperative care we will continue the regimen of Vicodin 10/325 mg every 6 hours as needed pain.  She has never abused the prescription and has taken as directed.  She will plan to follow-up here in our office when she has availability for follow-up x-rays and to possibly discuss revisional surgery.  I also explained to [place] that we have pursued multiple surgeries to her foot and I would completely understand if she would like to pursue a second opinion.  I would be more than happy to refer her or I am more than happy to send records if she finds a physician she would like  to have a second opinion with.  Unfortunately we have not had good outcomes postoperatively with all of her surgeries that we have performed. Denisa has been very understanding and appreciated the time spent this morning to discuss.   Thresa EMERSON Sar, DPM [facility]  Dr. Thresa EMERSON Sar, DPM    [address] [place], [place] [address]                Office [phone]  Fax [phone]

## 2023-11-19 ENCOUNTER — Encounter: Payer: Self-pay | Admitting: Podiatry

## 2023-11-20 ENCOUNTER — Other Ambulatory Visit: Payer: Self-pay | Admitting: Podiatry

## 2023-11-20 MED ORDER — HYDROCODONE-ACETAMINOPHEN 10-325 MG PO TABS: 1.0000 | ORAL_TABLET | Freq: Four times a day (QID) | ORAL | 0 refills | Status: DC | PRN

## 2023-11-20 NOTE — Progress Notes (Signed)
PRN chronic foot pain

## 2023-11-24 NOTE — Telephone Encounter (Signed)
 RX was sent 11/20/2023 and patient sent another message that was responded to.

## 2023-12-03 ENCOUNTER — Encounter: Payer: Self-pay | Admitting: Podiatry

## 2023-12-04 ENCOUNTER — Other Ambulatory Visit: Payer: Self-pay | Admitting: Podiatry

## 2023-12-04 MED ORDER — HYDROCODONE-ACETAMINOPHEN 10-325 MG PO TABS: 1.0000 | ORAL_TABLET | Freq: Four times a day (QID) | ORAL | 0 refills | Status: DC | PRN

## 2023-12-17 ENCOUNTER — Encounter: Payer: Self-pay | Admitting: Podiatry

## 2023-12-18 ENCOUNTER — Other Ambulatory Visit: Payer: Self-pay | Admitting: Podiatry

## 2023-12-18 ENCOUNTER — Encounter: Payer: Self-pay | Admitting: Podiatry

## 2023-12-18 MED ORDER — HYDROCODONE-ACETAMINOPHEN 10-325 MG PO TABS: 1.0000 | ORAL_TABLET | Freq: Four times a day (QID) | ORAL | 0 refills | Status: AC | PRN

## 2023-12-18 NOTE — Progress Notes (Signed)
 PRN chronic foot pain after multiple surgeries.

## 2024-01-02 ENCOUNTER — Other Ambulatory Visit: Payer: Self-pay | Admitting: Podiatry

## 2024-01-02 MED ORDER — HYDROCODONE-ACETAMINOPHEN 10-325 MG PO TABS: 1.0000 | ORAL_TABLET | ORAL | 0 refills | Status: DC | PRN

## 2024-01-02 NOTE — Progress Notes (Signed)
PRN chronic foot pain

## 2024-01-14 ENCOUNTER — Encounter: Payer: Self-pay | Admitting: Podiatry

## 2024-01-15 ENCOUNTER — Other Ambulatory Visit: Payer: Self-pay | Admitting: Podiatry

## 2024-01-15 MED ORDER — HYDROCODONE-ACETAMINOPHEN 10-325 MG PO TABS: 1.0000 | ORAL_TABLET | Freq: Four times a day (QID) | ORAL | 0 refills | Status: AC | PRN

## 2024-01-15 NOTE — Progress Notes (Signed)
 PRN chronic foot pain. Office to reach out to patient for f/u appt.   Thresa EMERSON Sar, DPM [facility]  Dr. Thresa EMERSON Sar, DPM    [address] [place], [place] [address]                Office [phone]  Fax [phone]

## 2024-01-31 ENCOUNTER — Encounter: Payer: Self-pay | Admitting: Podiatry

## 2024-01-31 ENCOUNTER — Ambulatory Visit (INDEPENDENT_AMBULATORY_CARE_PROVIDER_SITE_OTHER)

## 2024-01-31 ENCOUNTER — Ambulatory Visit (INDEPENDENT_AMBULATORY_CARE_PROVIDER_SITE_OTHER): Admitting: Podiatry

## 2024-01-31 VITALS — Ht 67.0 in | Wt 174.0 lb

## 2024-01-31 DIAGNOSIS — G5791 Unspecified mononeuropathy of right lower limb: Secondary | ICD-10-CM | POA: Diagnosis not present

## 2024-01-31 DIAGNOSIS — M79671 Pain in right foot: Secondary | ICD-10-CM | POA: Diagnosis not present

## 2024-01-31 DIAGNOSIS — G8929 Other chronic pain: Secondary | ICD-10-CM | POA: Diagnosis not present

## 2024-01-31 DIAGNOSIS — M960 Pseudarthrosis after fusion or arthrodesis: Secondary | ICD-10-CM

## 2024-01-31 MED ORDER — HYDROCODONE-ACETAMINOPHEN 10-325 MG PO TABS: 1.0000 | ORAL_TABLET | Freq: Four times a day (QID) | ORAL | 0 refills | Status: AC | PRN

## 2024-01-31 NOTE — Addendum Note (Signed)
 Addended by: JANIT THRESA HERO on: 01/31/2024 11:27 AM   Modules accepted: Orders

## 2024-01-31 NOTE — Addendum Note (Signed)
 Addended by: JANIT THRESA HERO on: 01/31/2024 11:35 AM   Modules accepted: Orders

## 2024-01-31 NOTE — Progress Notes (Addendum)
 Chief Complaint  Patient presents with   Foot Pain    Pt is here due to right foot pain, she states the pain is constant, some days are better then others, continues to have swelling.    Subjective:  Patient presents today status post revisional arthrodesis to the right great toe.  DOS: 04/06/2023.  Continues to have chronic pain associated to the foot  Past Medical History:  Diagnosis Date   Anxiety    on meds   Arthritis    generalized   Depression    on meds   GERD (gastroesophageal reflux disease)    on meds   Melanoma (HCC)    Seasonal allergies    Substance abuse (HCC)    currently taken preventative medications   Thyroid disease    on meds    Past Surgical History:  Procedure Laterality Date   CESAREAN SECTION  2008   FOOT SURGERY  08/2021   x 3 surgeries total   TONSILLECTOMY  1989   WISDOM TOOTH EXTRACTION  1989    Allergies  Allergen Reactions   Black Walnut Pollen Allergy Skin Test     Other reaction(s): Edema   Epinephrine     Other reaction(s): Irregular Heart Rate   Walnut     Other Reaction(s): edema   Fluticasone     Other reaction(s): Migraine   Promethazine     Other reaction(s): Palpitation    Objective/Physical Exam Neurovascular status intact.  Chronic edema noted around the surgical site of the right foot today with some ecchymosis around the midportion of the metatarsals and dorsum of the foot.  Pain now extends along the lateral column of the foot and lateral aspect of the leg likely secondary to compensation during gait and lateral column loading to offload pressure from the surgical arthrodesis site  Radiographic Exam RT foot 01/31/2024:  Increased nonunion noted to the first MTP.  Orthopedic hardware intact.  There is no radiolucency around the hardware.  No concern for loosening.  No malalignment.  Good alignment of the first ray.  Assessment: 1. s/p revisional right great toe arthrodesis. DOS: 04/06/2023 2.  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  Chronic foot pain right  Plan of Care:  -Patient was evaluated.  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  Continues to be painful -I do believe that the majority of the patient's pain and symptoms to the lateral aspect of the foot and lesser digits as well as the lateral aspect of the ankle and leg are secondary to compensation during walking.  I believe the primary issue to address is the nonunion to the great toe joint. -Continue Vicodin 10/325 mg Q8H PRN pain.  Refill sent today, 08/28/2023 - [facility]  levels and hormonal balance to facilitate healing -I do not believe that CRPS is present for the patient.  She does not meet the criteria for CRPS and I do not believe that a sympathetic nerve block would be beneficial since it does not address the underlying pathology of the nonunion to the great toe joint -Ultimately I do recommend surgery.  Surgery would either consist of revisional arthrodesis with bone autograft vs. removal of hardware with implantation of Silastic implant.  Personally, I favor removal of the hardware to the first MTP with Silastic implant.  She has failed 2  separate surgeries in an attempt to fuse the great toe joint and revisional fusion even with bone autograft seems more risk versus arthroplasty of the joint with a Silastic implant that will not require any fusion or bone healing across the arthrodesis site and the double stem Silastic implant will maintain alignment of the toe -This was discussed in detail with patient.  She will reach back out to the [place].  She has an appointment with them in January to discuss.  Greatly appreciated -Also referral placed for pain management at [facility] pain [facility] in [place], [place]   Thresa EMERSON Sar, DPM [facility]  Dr. Thresa EMERSON Sar, DPM    [address] [place], [place] [address]                Office [phone]  Fax [phone]

## 2024-02-07 ENCOUNTER — Encounter: Payer: Self-pay | Admitting: Podiatry

## 2024-02-08 ENCOUNTER — Encounter: Payer: Self-pay | Admitting: Lab

## 2024-02-13 ENCOUNTER — Encounter: Payer: Self-pay | Admitting: Podiatry

## 2024-02-13 ENCOUNTER — Other Ambulatory Visit: Payer: Self-pay | Admitting: Podiatry

## 2024-02-14 ENCOUNTER — Other Ambulatory Visit: Payer: Self-pay | Admitting: Podiatry

## 2024-02-14 MED ORDER — HYDROCODONE-ACETAMINOPHEN 10-325 MG PO TABS: 1.0000 | ORAL_TABLET | Freq: Three times a day (TID) | ORAL | 0 refills | Status: AC | PRN

## 2024-02-25 ENCOUNTER — Encounter: Payer: Self-pay | Admitting: Podiatry

## 2024-02-27 ENCOUNTER — Other Ambulatory Visit: Payer: Self-pay | Admitting: Podiatry

## 2024-02-27 MED ORDER — HYDROCODONE-ACETAMINOPHEN 10-325 MG PO TABS: 1.0000 | ORAL_TABLET | Freq: Four times a day (QID) | ORAL | 0 refills | Status: AC | PRN

## 2024-02-27 NOTE — Progress Notes (Signed)
PRN chronic foot pain

## 2024-03-17 ENCOUNTER — Encounter: Payer: Self-pay | Admitting: Podiatry

## 2024-03-18 ENCOUNTER — Other Ambulatory Visit: Payer: Self-pay | Admitting: Podiatry

## 2024-03-18 MED ORDER — HYDROCODONE-ACETAMINOPHEN 10-325 MG PO TABS: 1.0000 | ORAL_TABLET | Freq: Four times a day (QID) | ORAL | 0 refills | Status: DC | PRN

## 2024-03-18 NOTE — Progress Notes (Signed)
PRN chronic pain

## 2024-03-27 ENCOUNTER — Telehealth: Payer: Self-pay | Admitting: Lab

## 2024-03-27 ENCOUNTER — Encounter: Payer: Self-pay | Admitting: Podiatry

## 2024-03-27 NOTE — Telephone Encounter (Signed)
 Patient calling for refill on hydrocodone  please review and advise.

## 2024-03-28 ENCOUNTER — Other Ambulatory Visit: Payer: Self-pay | Admitting: Podiatry

## 2024-03-28 MED ORDER — HYDROCODONE-ACETAMINOPHEN 10-325 MG PO TABS: 1.0000 | ORAL_TABLET | Freq: Four times a day (QID) | ORAL | 0 refills | Status: DC | PRN

## 2024-03-28 NOTE — Progress Notes (Signed)
PRN chronic foot pain

## 2024-03-29 NOTE — Telephone Encounter (Signed)
 Addressed by provider.

## 2024-04-08 ENCOUNTER — Encounter: Payer: Self-pay | Admitting: Podiatry

## 2024-04-11 ENCOUNTER — Other Ambulatory Visit: Payer: Self-pay | Admitting: Podiatry

## 2024-04-11 MED ORDER — HYDROCODONE-ACETAMINOPHEN 10-325 MG PO TABS: 1.0000 | ORAL_TABLET | Freq: Four times a day (QID) | ORAL | 0 refills | Status: AC | PRN

## 2024-04-11 NOTE — Progress Notes (Signed)
PRN pain
